# Patient Record
Sex: Male | Born: 1970 | Race: Black or African American | Hispanic: No | Marital: Married | Smoking: Former smoker
Health system: Southern US, Community
[De-identification: ages and names within clinical notes are randomized; demographics above are authoritative.]

## PROBLEM LIST (undated history)

## (undated) DIAGNOSIS — I209 Angina pectoris, unspecified: Secondary | ICD-10-CM

## (undated) DIAGNOSIS — G4730 Sleep apnea, unspecified: Secondary | ICD-10-CM

## (undated) DIAGNOSIS — M1990 Unspecified osteoarthritis, unspecified site: Secondary | ICD-10-CM

## (undated) DIAGNOSIS — E079 Disorder of thyroid, unspecified: Secondary | ICD-10-CM

## (undated) DIAGNOSIS — F32A Depression, unspecified: Secondary | ICD-10-CM

## (undated) DIAGNOSIS — F419 Anxiety disorder, unspecified: Secondary | ICD-10-CM

## (undated) DIAGNOSIS — I219 Acute myocardial infarction, unspecified: Secondary | ICD-10-CM

## (undated) DIAGNOSIS — E119 Type 2 diabetes mellitus without complications: Secondary | ICD-10-CM

## (undated) HISTORY — DX: Depression, unspecified: F32.A

## (undated) HISTORY — PX: HERNIA REPAIR: SHX51

## (undated) HISTORY — DX: Unspecified osteoarthritis, unspecified site: M19.90

## (undated) HISTORY — DX: Anxiety disorder, unspecified: F41.9

## (undated) HISTORY — DX: Disorder of thyroid, unspecified: E07.9

## (undated) HISTORY — DX: Sleep apnea, unspecified: G47.30

---

## 2020-11-29 ENCOUNTER — Inpatient Hospital Stay (HOSPITAL_COMMUNITY): Payer: 59 | Admitting: Anesthesiology

## 2020-11-29 ENCOUNTER — Inpatient Hospital Stay (HOSPITAL_COMMUNITY)
Admission: EM | Disposition: A | Discharge status: Home / Self Care | Payer: Self-pay | Source: Home / Self Care | Attending: Surgery

## 2020-11-29 ENCOUNTER — Inpatient Hospital Stay (HOSPITAL_COMMUNITY): Payer: 59

## 2020-11-29 ENCOUNTER — Encounter (HOSPITAL_COMMUNITY): Payer: Self-pay | Admitting: Cardiovascular Disease

## 2020-11-29 ENCOUNTER — Inpatient Hospital Stay (HOSPITAL_COMMUNITY)
Admission: EM | Admit: 2020-11-29 | Discharge: 2020-12-04 | DRG: 234 | Disposition: A | Discharge status: Home / Self Care | Payer: 59 | Attending: Surgery | Admitting: Surgery

## 2020-11-29 DIAGNOSIS — I213 ST elevation (STEMI) myocardial infarction of unspecified site: Secondary | ICD-10-CM

## 2020-11-29 DIAGNOSIS — F17210 Nicotine dependence, cigarettes, uncomplicated: Secondary | ICD-10-CM | POA: Diagnosis present

## 2020-11-29 DIAGNOSIS — I2119 ST elevation (STEMI) myocardial infarction involving other coronary artery of inferior wall: Principal | ICD-10-CM

## 2020-11-29 DIAGNOSIS — I2102 ST elevation (STEMI) myocardial infarction involving left anterior descending coronary artery: Secondary | ICD-10-CM | POA: Diagnosis present

## 2020-11-29 DIAGNOSIS — Z8249 Family history of ischemic heart disease and other diseases of the circulatory system: Secondary | ICD-10-CM | POA: Diagnosis not present

## 2020-11-29 DIAGNOSIS — E663 Overweight: Secondary | ICD-10-CM | POA: Diagnosis present

## 2020-11-29 DIAGNOSIS — I2510 Atherosclerotic heart disease of native coronary artery without angina pectoris: Secondary | ICD-10-CM | POA: Diagnosis present

## 2020-11-29 DIAGNOSIS — I4891 Unspecified atrial fibrillation: Secondary | ICD-10-CM | POA: Diagnosis not present

## 2020-11-29 DIAGNOSIS — E119 Type 2 diabetes mellitus without complications: Secondary | ICD-10-CM | POA: Diagnosis present

## 2020-11-29 DIAGNOSIS — Z951 Presence of aortocoronary bypass graft: Principal | ICD-10-CM

## 2020-11-29 DIAGNOSIS — Z7984 Long term (current) use of oral hypoglycemic drugs: Secondary | ICD-10-CM | POA: Diagnosis not present

## 2020-11-29 DIAGNOSIS — I10 Essential (primary) hypertension: Secondary | ICD-10-CM | POA: Diagnosis present

## 2020-11-29 DIAGNOSIS — Z20822 Contact with and (suspected) exposure to covid-19: Secondary | ICD-10-CM | POA: Diagnosis not present

## 2020-11-29 DIAGNOSIS — E785 Hyperlipidemia, unspecified: Secondary | ICD-10-CM | POA: Diagnosis present

## 2020-11-29 DIAGNOSIS — I25110 Atherosclerotic heart disease of native coronary artery with unstable angina pectoris: Secondary | ICD-10-CM | POA: Diagnosis not present

## 2020-11-29 HISTORY — DX: Acute myocardial infarction, unspecified: I21.9

## 2020-11-29 HISTORY — PX: CORONARY ARTERY BYPASS GRAFT: SHX141

## 2020-11-29 HISTORY — DX: Type 2 diabetes mellitus without complications: E11.9

## 2020-11-29 HISTORY — PX: CORONARY/GRAFT ACUTE MI REVASCULARIZATION: CATH118305

## 2020-11-29 HISTORY — DX: Angina pectoris, unspecified: I20.9

## 2020-11-29 HISTORY — PX: TEE WITHOUT CARDIOVERSION: SHX5443

## 2020-11-29 LAB — POCT I-STAT, CHEM 8
BUN: 17 mg/dL (ref 6–20)
BUN: 16 mg/dL (ref 6–20)
BUN: 16 mg/dL (ref 6–20)
BUN: 16 mg/dL (ref 6–20)
BUN: 15 mg/dL (ref 6–20)
Calcium, Ion: 1.23 mmol/L (ref 1.15–1.40)
Calcium, Ion: 1.21 mmol/L (ref 1.15–1.40)
Calcium, Ion: 1.08 mmol/L — ABNORMAL LOW (ref 1.15–1.40)
Calcium, Ion: 1.2 mmol/L (ref 1.15–1.40)
Calcium, Ion: 1.11 mmol/L — ABNORMAL LOW (ref 1.15–1.40)
Chloride: 108 mmol/L (ref 98–111)
Chloride: 106 mmol/L (ref 98–111)
Chloride: 105 mmol/L (ref 98–111)
Chloride: 107 mmol/L (ref 98–111)
Chloride: 105 mmol/L (ref 98–111)
Creatinine, Ser: 0.9 mg/dL (ref 0.61–1.24)
Creatinine, Ser: 0.9 mg/dL (ref 0.61–1.24)
Creatinine, Ser: 0.9 mg/dL (ref 0.61–1.24)
Creatinine, Ser: 0.9 mg/dL (ref 0.61–1.24)
Creatinine, Ser: 0.9 mg/dL (ref 0.61–1.24)
Glucose, Bld: 122 mg/dL — ABNORMAL HIGH (ref 70–99)
Glucose, Bld: 102 mg/dL — ABNORMAL HIGH (ref 70–99)
Glucose, Bld: 109 mg/dL — ABNORMAL HIGH (ref 70–99)
Glucose, Bld: 112 mg/dL — ABNORMAL HIGH (ref 70–99)
Glucose, Bld: 112 mg/dL — ABNORMAL HIGH (ref 70–99)
HCT: 38 % — ABNORMAL LOW (ref 39.0–52.0)
HCT: 37 % — ABNORMAL LOW (ref 39.0–52.0)
HCT: 28 % — ABNORMAL LOW (ref 39.0–52.0)
HCT: 34 % — ABNORMAL LOW (ref 39.0–52.0)
HCT: 31 % — ABNORMAL LOW (ref 39.0–52.0)
Hemoglobin: 12.9 g/dL — ABNORMAL LOW (ref 13.0–17.0)
Hemoglobin: 12.6 g/dL — ABNORMAL LOW (ref 13.0–17.0)
Hemoglobin: 11.6 g/dL — ABNORMAL LOW (ref 13.0–17.0)
Hemoglobin: 9.5 g/dL — ABNORMAL LOW (ref 13.0–17.0)
Hemoglobin: 10.5 g/dL — ABNORMAL LOW (ref 13.0–17.0)
Potassium: 4.6 mmol/L (ref 3.5–5.1)
Potassium: 4.4 mmol/L (ref 3.5–5.1)
Potassium: 4.4 mmol/L (ref 3.5–5.1)
Potassium: 5.4 mmol/L — ABNORMAL HIGH (ref 3.5–5.1)
Potassium: 4.6 mmol/L (ref 3.5–5.1)
Sodium: 141 mmol/L (ref 135–145)
Sodium: 141 mmol/L (ref 135–145)
Sodium: 138 mmol/L (ref 135–145)
Sodium: 141 mmol/L (ref 135–145)
Sodium: 140 mmol/L (ref 135–145)
TCO2: 22 mmol/L (ref 22–32)
TCO2: 25 mmol/L (ref 22–32)
TCO2: 24 mmol/L (ref 22–32)
TCO2: 25 mmol/L (ref 22–32)
TCO2: 23 mmol/L (ref 22–32)

## 2020-11-29 LAB — HEMOGLOBIN AND HEMATOCRIT, BLOOD
HCT: 28.8 % — ABNORMAL LOW (ref 39.0–52.0)
Hemoglobin: 9 g/dL — ABNORMAL LOW (ref 13.0–17.0)

## 2020-11-29 LAB — POCT I-STAT 7, (LYTES, BLD GAS, ICA,H+H)
Acid-Base Excess: 1 mmol/L (ref 0.0–2.0)
Acid-Base Excess: 1 mmol/L (ref 0.0–2.0)
Acid-Base Excess: 0 mmol/L (ref 0.0–2.0)
Acid-base deficit: 2 mmol/L (ref 0.0–2.0)
Acid-base deficit: 2 mmol/L (ref 0.0–2.0)
Acid-base deficit: 2 mmol/L (ref 0.0–2.0)
Bicarbonate: 23.6 mmol/L (ref 20.0–28.0)
Bicarbonate: 24.2 mmol/L (ref 20.0–28.0)
Bicarbonate: 24.9 mmol/L (ref 20.0–28.0)
Bicarbonate: 25.9 mmol/L (ref 20.0–28.0)
Bicarbonate: 26.1 mmol/L (ref 20.0–28.0)
Bicarbonate: 26.4 mmol/L (ref 20.0–28.0)
Calcium, Ion: 1 mmol/L — ABNORMAL LOW (ref 1.15–1.40)
Calcium, Ion: 1.06 mmol/L — ABNORMAL LOW (ref 1.15–1.40)
Calcium, Ion: 1.12 mmol/L — ABNORMAL LOW (ref 1.15–1.40)
Calcium, Ion: 1.13 mmol/L — ABNORMAL LOW (ref 1.15–1.40)
Calcium, Ion: 1.16 mmol/L (ref 1.15–1.40)
Calcium, Ion: 1.21 mmol/L (ref 1.15–1.40)
HCT: 30 % — ABNORMAL LOW (ref 39.0–52.0)
HCT: 31 % — ABNORMAL LOW (ref 39.0–52.0)
HCT: 33 % — ABNORMAL LOW (ref 39.0–52.0)
HCT: 33 % — ABNORMAL LOW (ref 39.0–52.0)
HCT: 34 % — ABNORMAL LOW (ref 39.0–52.0)
HCT: 39 % (ref 39.0–52.0)
Hemoglobin: 10.2 g/dL — ABNORMAL LOW (ref 13.0–17.0)
Hemoglobin: 10.5 g/dL — ABNORMAL LOW (ref 13.0–17.0)
Hemoglobin: 11.2 g/dL — ABNORMAL LOW (ref 13.0–17.0)
Hemoglobin: 11.2 g/dL — ABNORMAL LOW (ref 13.0–17.0)
Hemoglobin: 11.6 g/dL — ABNORMAL LOW (ref 13.0–17.0)
Hemoglobin: 13.3 g/dL (ref 13.0–17.0)
O2 Saturation: 100 %
O2 Saturation: 100 %
O2 Saturation: 94 %
O2 Saturation: 98 %
O2 Saturation: 99 %
O2 Saturation: 99 %
Patient temperature: 36.4
Patient temperature: 37.4
Potassium: 4 mmol/L (ref 3.5–5.1)
Potassium: 4.4 mmol/L (ref 3.5–5.1)
Potassium: 4.4 mmol/L (ref 3.5–5.1)
Potassium: 4.6 mmol/L (ref 3.5–5.1)
Potassium: 4.6 mmol/L (ref 3.5–5.1)
Potassium: 5.4 mmol/L — ABNORMAL HIGH (ref 3.5–5.1)
Sodium: 138 mmol/L (ref 135–145)
Sodium: 141 mmol/L (ref 135–145)
Sodium: 142 mmol/L (ref 135–145)
Sodium: 142 mmol/L (ref 135–145)
Sodium: 142 mmol/L (ref 135–145)
Sodium: 142 mmol/L (ref 135–145)
TCO2: 25 mmol/L (ref 22–32)
TCO2: 26 mmol/L (ref 22–32)
TCO2: 26 mmol/L (ref 22–32)
TCO2: 27 mmol/L (ref 22–32)
TCO2: 27 mmol/L (ref 22–32)
TCO2: 28 mmol/L (ref 22–32)
pCO2 arterial: 42.7 mmHg (ref 32.0–48.0)
pCO2 arterial: 44.3 mmHg (ref 32.0–48.0)
pCO2 arterial: 45 mmHg (ref 32.0–48.0)
pCO2 arterial: 46 mmHg (ref 32.0–48.0)
pCO2 arterial: 49.4 mmHg — ABNORMAL HIGH (ref 32.0–48.0)
pCO2 arterial: 49.7 mmHg — ABNORMAL HIGH (ref 32.0–48.0)
pH, Arterial: 7.311 — ABNORMAL LOW (ref 7.350–7.450)
pH, Arterial: 7.328 — ABNORMAL LOW (ref 7.350–7.450)
pH, Arterial: 7.328 — ABNORMAL LOW (ref 7.350–7.450)
pH, Arterial: 7.331 — ABNORMAL LOW (ref 7.350–7.450)
pH, Arterial: 7.379 (ref 7.350–7.450)
pH, Arterial: 7.392 (ref 7.350–7.450)
pO2, Arterial: 125 mmHg — ABNORMAL HIGH (ref 83.0–108.0)
pO2, Arterial: 145 mmHg — ABNORMAL HIGH (ref 83.0–108.0)
pO2, Arterial: 166 mmHg — ABNORMAL HIGH (ref 83.0–108.0)
pO2, Arterial: 290 mmHg — ABNORMAL HIGH (ref 83.0–108.0)
pO2, Arterial: 310 mmHg — ABNORMAL HIGH (ref 83.0–108.0)
pO2, Arterial: 78 mmHg — ABNORMAL LOW (ref 83.0–108.0)

## 2020-11-29 LAB — CBC
HCT: 40.1 % (ref 39.0–52.0)
HCT: 35 % — ABNORMAL LOW (ref 39.0–52.0)
Hemoglobin: 12.3 g/dL — ABNORMAL LOW (ref 13.0–17.0)
Hemoglobin: 10.7 g/dL — ABNORMAL LOW (ref 13.0–17.0)
MCH: 22.7 pg — ABNORMAL LOW (ref 26.0–34.0)
MCH: 22.8 pg — ABNORMAL LOW (ref 26.0–34.0)
MCHC: 30.7 g/dL (ref 30.0–36.0)
MCHC: 30.6 g/dL (ref 30.0–36.0)
MCV: 74 fL — ABNORMAL LOW (ref 80.0–100.0)
MCV: 74.6 fL — ABNORMAL LOW (ref 80.0–100.0)
Platelets: 303 10*3/uL (ref 150–400)
Platelets: 215 10*3/uL (ref 150–400)
RBC: 5.42 MIL/uL (ref 4.22–5.81)
RBC: 4.69 MIL/uL (ref 4.22–5.81)
RDW: 15.6 % — ABNORMAL HIGH (ref 11.5–15.5)
RDW: 15.6 % — ABNORMAL HIGH (ref 11.5–15.5)
WBC: 7.6 10*3/uL (ref 4.0–10.5)
WBC: 8.9 10*3/uL (ref 4.0–10.5)
nRBC: 0 % (ref 0.0–0.2)
nRBC: 0 % (ref 0.0–0.2)

## 2020-11-29 LAB — POCT I-STAT EG7
Acid-Base Excess: 0 mmol/L (ref 0.0–2.0)
Bicarbonate: 25.8 mmol/L (ref 20.0–28.0)
Calcium, Ion: 1.06 mmol/L — ABNORMAL LOW (ref 1.15–1.40)
HCT: 30 % — ABNORMAL LOW (ref 39.0–52.0)
Hemoglobin: 10.2 g/dL — ABNORMAL LOW (ref 13.0–17.0)
O2 Saturation: 76 %
Potassium: 5.5 mmol/L — ABNORMAL HIGH (ref 3.5–5.1)
Sodium: 142 mmol/L (ref 135–145)
TCO2: 27 mmol/L (ref 22–32)
pCO2, Ven: 45.2 mmHg (ref 44.0–60.0)
pH, Ven: 7.364 (ref 7.250–7.430)
pO2, Ven: 43 mmHg (ref 32.0–45.0)

## 2020-11-29 LAB — GLUCOSE, CAPILLARY
Glucose-Capillary: 103 mg/dL — ABNORMAL HIGH (ref 70–99)
Glucose-Capillary: 107 mg/dL — ABNORMAL HIGH (ref 70–99)
Glucose-Capillary: 114 mg/dL — ABNORMAL HIGH (ref 70–99)
Glucose-Capillary: 121 mg/dL — ABNORMAL HIGH (ref 70–99)

## 2020-11-29 LAB — COMPREHENSIVE METABOLIC PANEL
ALT: 16 U/L (ref 0–44)
AST: 16 U/L (ref 15–41)
Albumin: 3.3 g/dL — ABNORMAL LOW (ref 3.5–5.0)
Alkaline Phosphatase: 41 U/L (ref 38–126)
Anion gap: 7 (ref 5–15)
BUN: 16 mg/dL (ref 6–20)
CO2: 23 mmol/L (ref 22–32)
Calcium: 8.4 mg/dL — ABNORMAL LOW (ref 8.9–10.3)
Chloride: 108 mmol/L (ref 98–111)
Creatinine, Ser: 0.91 mg/dL (ref 0.61–1.24)
GFR, Estimated: >60 mL/min (ref >60)
Glucose, Bld: 94 mg/dL (ref 70–99)
Potassium: 4.5 mmol/L (ref 3.5–5.1)
Sodium: 138 mmol/L (ref 135–145)
Total Bilirubin: 0.6 mg/dL (ref 0.3–1.2)
Total Protein: 6.1 g/dL — ABNORMAL LOW (ref 6.5–8.1)

## 2020-11-29 LAB — PROTIME-INR
INR: 1.2 (ref 0.8–1.2)
INR: 1.2 (ref 0.8–1.2)
Prothrombin Time: 14.9 seconds (ref 11.4–15.2)
Prothrombin Time: 15 seconds (ref 11.4–15.2)

## 2020-11-29 LAB — MRSA PCR SCREENING: MRSA by PCR: NEGATIVE

## 2020-11-29 LAB — HEMOGLOBIN A1C
Hgb A1c MFr Bld: 7.3 % — ABNORMAL HIGH (ref 4.8–5.6)
Mean Plasma Glucose: 162.81 mg/dL

## 2020-11-29 LAB — POCT ACTIVATED CLOTTING TIME: Activated Clotting Time: 297 seconds

## 2020-11-29 LAB — RESP PANEL BY RT-PCR (FLU A&B, COVID) ARPGX2
Influenza A by PCR: NEGATIVE
Influenza B by PCR: NEGATIVE
SARS Coronavirus 2 by RT PCR: NEGATIVE

## 2020-11-29 LAB — PLATELET COUNT: Platelets: 230 10*3/uL (ref 150–400)

## 2020-11-29 LAB — PREPARE RBC (CROSSMATCH)

## 2020-11-29 LAB — ABO/RH: ABO/RH(D): B POS

## 2020-11-29 LAB — APTT
aPTT: >200 seconds (ref 24–36)
aPTT: 32 seconds (ref 24–36)

## 2020-11-29 SURGERY — CORONARY ARTERY BYPASS GRAFTING (CABG)
Anesthesia: General | Site: Chest

## 2020-11-29 SURGERY — CORONARY/GRAFT ACUTE MI REVASCULARIZATION
Anesthesia: LOCAL

## 2020-11-29 MED ORDER — ACETAMINOPHEN 500 MG PO TABS
1000.0000 mg | ORAL_TABLET | Freq: Four times a day (QID) | ORAL | Status: DC
  Administered 2020-11-30 – 2020-12-01 (×5): 1000 mg via ORAL
  Filled 2020-11-29 (×5): qty 2

## 2020-11-29 MED ORDER — ACETAMINOPHEN 160 MG/5ML PO SOLN: 1000.0000 mg | Freq: Four times a day (QID) | ORAL | Status: DC

## 2020-11-29 MED ORDER — FENTANYL CITRATE (PF) 250 MCG/5ML IJ SOLN
INTRAMUSCULAR | Status: DC | PRN
  Administered 2020-11-29: 200 ug via INTRAVENOUS
  Administered 2020-11-29: 250 ug via INTRAVENOUS
  Administered 2020-11-29: 200 ug via INTRAVENOUS
  Administered 2020-11-29: 150 ug via INTRAVENOUS
  Administered 2020-11-29 (×3): 100 ug via INTRAVENOUS

## 2020-11-29 MED ORDER — FENTANYL CITRATE (PF) 250 MCG/5ML IJ SOLN
INTRAMUSCULAR | Status: AC
  Filled 2020-11-29: qty 25

## 2020-11-29 MED ORDER — EPINEPHRINE HCL 5 MG/250ML IV SOLN IN NS
0.0000 ug/min | INTRAVENOUS | Status: DC
Start: 2020-11-29 — End: 2020-11-29
  Filled 2020-11-29: qty 250

## 2020-11-29 MED ORDER — METOPROLOL TARTRATE 12.5 MG HALF TABLET: 12.5000 mg | ORAL_TABLET | Freq: Two times a day (BID) | ORAL | Status: DC

## 2020-11-29 MED ORDER — INSULIN REGULAR(HUMAN) IN NACL 100-0.9 UT/100ML-% IV SOLN
INTRAVENOUS | Status: AC
  Administered 2020-11-29: 2 [IU]/h via INTRAVENOUS
  Filled 2020-11-29: qty 100

## 2020-11-29 MED ORDER — CHLORHEXIDINE GLUCONATE CLOTH 2 % EX PADS: 6.0000 | MEDICATED_PAD | Freq: Every day | CUTANEOUS | Status: DC

## 2020-11-29 MED ORDER — LIDOCAINE HCL (PF) 1 % IJ SOLN
INTRAMUSCULAR | Status: DC | PRN
  Administered 2020-11-29: 2 mL

## 2020-11-29 MED ORDER — ACETAMINOPHEN 160 MG/5ML PO SOLN: 650.0000 mg | Freq: Once | ORAL | Status: AC

## 2020-11-29 MED ORDER — TRAMADOL HCL 50 MG PO TABS
50.0000 mg | ORAL_TABLET | ORAL | Status: DC | PRN
  Administered 2020-11-30 (×2): 100 mg via ORAL
  Administered 2020-11-30: 50 mg via ORAL
  Administered 2020-12-01: 100 mg via ORAL
  Filled 2020-11-29: qty 2
  Filled 2020-11-29: qty 1
  Filled 2020-11-29 (×2): qty 2

## 2020-11-29 MED ORDER — PHENYLEPHRINE HCL-NACL 20-0.9 MG/250ML-% IV SOLN
0.0000 ug/min | INTRAVENOUS | Status: DC
Start: 2020-11-29 — End: 2020-12-01
  Administered 2020-11-30: 10 ug/min via INTRAVENOUS
  Filled 2020-11-29: qty 250

## 2020-11-29 MED ORDER — SODIUM CHLORIDE 0.9 % IV SOLN
INTRAVENOUS | Status: DC
  Filled 2020-11-29: qty 30

## 2020-11-29 MED ORDER — NITROGLYCERIN 1 MG/10 ML FOR IR/CATH LAB
INTRA_ARTERIAL | Status: AC
  Filled 2020-11-29: qty 10

## 2020-11-29 MED ORDER — DOCUSATE SODIUM 100 MG PO CAPS
200.0000 mg | ORAL_CAPSULE | Freq: Every day | ORAL | Status: DC
  Administered 2020-11-30 – 2020-12-01 (×2): 200 mg via ORAL
  Filled 2020-11-29 (×2): qty 2

## 2020-11-29 MED ORDER — SODIUM CHLORIDE 0.9% FLUSH: 3.0000 mL | Freq: Two times a day (BID) | INTRAVENOUS | Status: DC

## 2020-11-29 MED ORDER — HEPARIN SODIUM (PORCINE) 1000 UNIT/ML IJ SOLN
INTRAMUSCULAR | Status: DC | PRN
  Administered 2020-11-29: 30000 [IU] via INTRAVENOUS

## 2020-11-29 MED ORDER — CEFAZOLIN SODIUM-DEXTROSE 2-4 GM/100ML-% IV SOLN
2.0000 g | INTRAVENOUS | Status: DC
  Filled 2020-11-29: qty 100

## 2020-11-29 MED ORDER — MIDAZOLAM HCL 2 MG/2ML IJ SOLN
2.0000 mg | INTRAMUSCULAR | Status: DC | PRN
  Administered 2020-11-29: 2 mg via INTRAVENOUS
  Filled 2020-11-29: qty 2

## 2020-11-29 MED ORDER — HEPARIN SODIUM (PORCINE) 1000 UNIT/ML IJ SOLN
INTRAMUSCULAR | Status: DC | PRN
  Administered 2020-11-29: 10000 [IU] via INTRAVENOUS

## 2020-11-29 MED ORDER — THROMBIN 20000 UNITS EX SOLR
CUTANEOUS | Status: DC | PRN
  Administered 2020-11-29: 20000 [IU] via TOPICAL

## 2020-11-29 MED ORDER — PROPOFOL 10 MG/ML IV BOLUS
INTRAVENOUS | Status: AC
  Filled 2020-11-29: qty 40

## 2020-11-29 MED ORDER — TRANEXAMIC ACID (OHS) BOLUS VIA INFUSION
15.0000 mg/kg | INTRAVENOUS | Status: AC
  Administered 2020-11-29: 1497 mg via INTRAVENOUS
  Filled 2020-11-29: qty 1497

## 2020-11-29 MED ORDER — ASPIRIN 81 MG PO CHEW: 81.0000 mg | CHEWABLE_TABLET | Freq: Every day | ORAL | Status: DC

## 2020-11-29 MED ORDER — LACTATED RINGERS IV SOLN: INTRAVENOUS | Status: DC

## 2020-11-29 MED ORDER — LACTATED RINGERS IV SOLN
500.0000 mL | Freq: Once | INTRAVENOUS | Status: AC | PRN
  Administered 2020-11-30: 500 mL via INTRAVENOUS

## 2020-11-29 MED ORDER — PHENYLEPHRINE 40 MCG/ML (10ML) SYRINGE FOR IV PUSH (FOR BLOOD PRESSURE SUPPORT)
PREFILLED_SYRINGE | INTRAVENOUS | Status: DC | PRN
  Administered 2020-11-29: 80 ug via INTRAVENOUS

## 2020-11-29 MED ORDER — VERAPAMIL HCL 2.5 MG/ML IV SOLN: INTRA_ARTERIAL | Status: DC | PRN

## 2020-11-29 MED ORDER — ONDANSETRON HCL 4 MG/2ML IJ SOLN: 4.0000 mg | Freq: Four times a day (QID) | INTRAMUSCULAR | Status: DC | PRN

## 2020-11-29 MED ORDER — CEFAZOLIN SODIUM-DEXTROSE 2-4 GM/100ML-% IV SOLN
2.0000 g | INTRAVENOUS | Status: AC
  Administered 2020-11-29 (×2): 2 g via INTRAVENOUS
  Filled 2020-11-29: qty 100

## 2020-11-29 MED ORDER — LACTATED RINGERS IV SOLN: INTRAVENOUS | Status: DC | PRN

## 2020-11-29 MED ORDER — VERAPAMIL HCL 2.5 MG/ML IV SOLN
INTRAVENOUS | Status: AC
  Filled 2020-11-29: qty 2

## 2020-11-29 MED ORDER — SODIUM CHLORIDE 0.45 % IV SOLN: INTRAVENOUS | Status: DC | PRN

## 2020-11-29 MED ORDER — MIDAZOLAM HCL (PF) 10 MG/2ML IJ SOLN
INTRAMUSCULAR | Status: AC
  Filled 2020-11-29: qty 2

## 2020-11-29 MED ORDER — PANTOPRAZOLE SODIUM 40 MG PO TBEC
40.0000 mg | DELAYED_RELEASE_TABLET | Freq: Every day | ORAL | Status: DC
  Administered 2020-12-01: 40 mg via ORAL
  Filled 2020-11-29: qty 1

## 2020-11-29 MED ORDER — MAGNESIUM SULFATE 50 % IJ SOLN
40.0000 meq | INTRAMUSCULAR | Status: DC
  Filled 2020-11-29: qty 9.85

## 2020-11-29 MED ORDER — LABETALOL HCL 5 MG/ML IV SOLN: 10.0000 mg | INTRAVENOUS | Status: DC | PRN

## 2020-11-29 MED ORDER — METOPROLOL TARTRATE 5 MG/5ML IV SOLN
2.5000 mg | INTRAVENOUS | Status: DC | PRN
Start: 2020-11-29 — End: 2020-12-01

## 2020-11-29 MED ORDER — INSULIN REGULAR(HUMAN) IN NACL 100-0.9 UT/100ML-% IV SOLN: INTRAVENOUS | Status: DC

## 2020-11-29 MED ORDER — PLASMA-LYTE 148 IV SOLN
INTRAVENOUS | Status: AC
  Administered 2020-11-29: 14 mL
  Filled 2020-11-29: qty 2.5

## 2020-11-29 MED ORDER — CHLORHEXIDINE GLUCONATE 0.12 % MT SOLN
OROMUCOSAL | Status: AC
  Administered 2020-11-29: 15 mL via OROMUCOSAL
  Filled 2020-11-29: qty 15

## 2020-11-29 MED ORDER — PROPOFOL 10 MG/ML IV BOLUS
INTRAVENOUS | Status: DC | PRN
  Administered 2020-11-29: 50 mg via INTRAVENOUS
  Administered 2020-11-29: 150 mg via INTRAVENOUS

## 2020-11-29 MED ORDER — 0.9 % SODIUM CHLORIDE (POUR BTL) OPTIME
TOPICAL | Status: DC | PRN
  Administered 2020-11-29: 4000 mL

## 2020-11-29 MED ORDER — PROTAMINE SULFATE 10 MG/ML IV SOLN
INTRAVENOUS | Status: AC
  Filled 2020-11-29: qty 5

## 2020-11-29 MED ORDER — CHLORHEXIDINE GLUCONATE 0.12 % MT SOLN
15.0000 mL | Freq: Once | OROMUCOSAL | Status: AC
  Filled 2020-11-29: qty 15

## 2020-11-29 MED ORDER — SODIUM CHLORIDE 0.9% FLUSH
10.0000 mL | Freq: Two times a day (BID) | INTRAVENOUS | Status: DC
  Administered 2020-11-30: 10 mL

## 2020-11-29 MED ORDER — ASPIRIN EC 325 MG PO TBEC
325.0000 mg | DELAYED_RELEASE_TABLET | Freq: Every day | ORAL | Status: DC
  Administered 2020-11-30 – 2020-12-01 (×2): 325 mg via ORAL
  Filled 2020-11-29 (×2): qty 1

## 2020-11-29 MED ORDER — ROCURONIUM BROMIDE 10 MG/ML (PF) SYRINGE
PREFILLED_SYRINGE | INTRAVENOUS | Status: DC | PRN
  Administered 2020-11-29: 40 mg via INTRAVENOUS
  Administered 2020-11-29 (×2): 50 mg via INTRAVENOUS
  Administered 2020-11-29: 60 mg via INTRAVENOUS

## 2020-11-29 MED ORDER — ALBUMIN HUMAN 5 % IV SOLN: INTRAVENOUS | Status: DC | PRN

## 2020-11-29 MED ORDER — ACETAMINOPHEN 325 MG PO TABS: 650.0000 mg | ORAL_TABLET | ORAL | Status: DC | PRN

## 2020-11-29 MED ORDER — TRANEXAMIC ACID 1000 MG/10ML IV SOLN
1.5000 mg/kg/h | INTRAVENOUS | Status: AC
  Administered 2020-11-29: 1.5 mg/kg/h via INTRAVENOUS
  Filled 2020-11-29: qty 25

## 2020-11-29 MED ORDER — DEXMEDETOMIDINE HCL IN NACL 400 MCG/100ML IV SOLN
0.1000 ug/kg/h | INTRAVENOUS | Status: AC
Start: 2020-11-29 — End: 2020-11-29
  Administered 2020-11-29: .7 ug/kg/h via INTRAVENOUS
  Filled 2020-11-29: qty 100

## 2020-11-29 MED ORDER — SODIUM CHLORIDE 0.9% FLUSH: 10.0000 mL | INTRAVENOUS | Status: DC | PRN

## 2020-11-29 MED ORDER — PROTAMINE SULFATE 10 MG/ML IV SOLN
INTRAVENOUS | Status: DC | PRN
  Administered 2020-11-29: 30 mg via INTRAVENOUS
  Administered 2020-11-29: 270 mg via INTRAVENOUS

## 2020-11-29 MED ORDER — MILRINONE LACTATE IN DEXTROSE 20-5 MG/100ML-% IV SOLN
0.3000 ug/kg/min | INTRAVENOUS | Status: DC
  Filled 2020-11-29: qty 100

## 2020-11-29 MED ORDER — ALBUMIN HUMAN 5 % IV SOLN
250.0000 mL | INTRAVENOUS | Status: AC | PRN
  Administered 2020-11-29 – 2020-11-30 (×4): 12.5 g via INTRAVENOUS
  Filled 2020-11-29 (×2): qty 250

## 2020-11-29 MED ORDER — DEXMEDETOMIDINE HCL IN NACL 400 MCG/100ML IV SOLN
0.0000 ug/kg/h | INTRAVENOUS | Status: DC
  Administered 2020-11-29: 0.5 ug/kg/h via INTRAVENOUS
  Filled 2020-11-29: qty 100

## 2020-11-29 MED ORDER — POTASSIUM CHLORIDE 10 MEQ/50ML IV SOLN: 10.0000 meq | INTRAVENOUS | Status: AC

## 2020-11-29 MED ORDER — MORPHINE SULFATE (PF) 2 MG/ML IV SOLN
1.0000 mg | INTRAVENOUS | Status: DC | PRN
  Administered 2020-11-29: 2 mg via INTRAVENOUS
  Administered 2020-11-30: 4 mg via INTRAVENOUS
  Filled 2020-11-29: qty 2
  Filled 2020-11-29: qty 1

## 2020-11-29 MED ORDER — HEPARIN (PORCINE) IN NACL 1000-0.9 UT/500ML-% IV SOLN
INTRAVENOUS | Status: DC | PRN
  Administered 2020-11-29 (×2): 500 mL

## 2020-11-29 MED ORDER — VANCOMYCIN HCL 1500 MG/300ML IV SOLN
1500.0000 mg | INTRAVENOUS | Status: AC
  Administered 2020-11-29: 1500 mg via INTRAVENOUS
  Filled 2020-11-29: qty 300

## 2020-11-29 MED ORDER — SODIUM CHLORIDE 0.9 % IV SOLN: INTRAVENOUS | Status: DC

## 2020-11-29 MED ORDER — IOHEXOL 350 MG/ML SOLN
INTRAVENOUS | Status: DC | PRN
  Administered 2020-11-29: 75 mL

## 2020-11-29 MED ORDER — HEMOSTATIC AGENTS (NO CHARGE) OPTIME
TOPICAL | Status: DC | PRN
  Administered 2020-11-29: 1 via TOPICAL

## 2020-11-29 MED ORDER — PLASMA-LYTE 148 IV SOLN
INTRAVENOUS | Status: DC | PRN
  Administered 2020-11-29: 500 mL via INTRAVASCULAR

## 2020-11-29 MED ORDER — ASPIRIN 81 MG PO CHEW: 324.0000 mg | CHEWABLE_TABLET | Freq: Every day | ORAL | Status: DC

## 2020-11-29 MED ORDER — CHLORHEXIDINE GLUCONATE 0.12% ORAL RINSE (MEDLINE KIT)
15.0000 mL | Freq: Two times a day (BID) | OROMUCOSAL | Status: DC
  Administered 2020-11-29: 15 mL via OROMUCOSAL

## 2020-11-29 MED ORDER — HEPARIN (PORCINE) IN NACL 1000-0.9 UT/500ML-% IV SOLN
INTRAVENOUS | Status: AC
  Filled 2020-11-29: qty 1000

## 2020-11-29 MED ORDER — BISACODYL 10 MG RE SUPP: 10.0000 mg | Freq: Every day | RECTAL | Status: DC

## 2020-11-29 MED ORDER — SODIUM CHLORIDE 0.9 % IV SOLN: INTRAVENOUS | Status: DC | PRN

## 2020-11-29 MED ORDER — CEFAZOLIN SODIUM-DEXTROSE 2-4 GM/100ML-% IV SOLN
2.0000 g | Freq: Three times a day (TID) | INTRAVENOUS | Status: DC
  Administered 2020-11-29 – 2020-12-01 (×5): 2 g via INTRAVENOUS
  Filled 2020-11-29 (×6): qty 100

## 2020-11-29 MED ORDER — HEPARIN (PORCINE) IN NACL 2000-0.9 UNIT/L-% IV SOLN
INTRAVENOUS | Status: AC
  Filled 2020-11-29: qty 1000

## 2020-11-29 MED ORDER — THROMBIN (RECOMBINANT) 20000 UNITS EX SOLR
CUTANEOUS | Status: AC
  Filled 2020-11-29: qty 20000

## 2020-11-29 MED ORDER — SODIUM CHLORIDE 0.9 % IV SOLN: 250.0000 mL | INTRAVENOUS | Status: DC | PRN

## 2020-11-29 MED ORDER — METOPROLOL TARTRATE 25 MG/10 ML ORAL SUSPENSION: 12.5000 mg | Freq: Two times a day (BID) | ORAL | Status: DC

## 2020-11-29 MED ORDER — BISACODYL 5 MG PO TBEC
10.0000 mg | DELAYED_RELEASE_TABLET | Freq: Every day | ORAL | Status: DC
  Administered 2020-11-30 – 2020-12-01 (×2): 10 mg via ORAL
  Filled 2020-11-29 (×2): qty 2

## 2020-11-29 MED ORDER — SODIUM CHLORIDE 0.9% FLUSH: 3.0000 mL | INTRAVENOUS | Status: DC | PRN

## 2020-11-29 MED ORDER — THROMBIN 20000 UNITS EX SOLR
OROMUCOSAL | Status: DC | PRN
  Administered 2020-11-29 (×3): 4 mL via TOPICAL

## 2020-11-29 MED ORDER — VANCOMYCIN HCL IN DEXTROSE 1-5 GM/200ML-% IV SOLN
1000.0000 mg | Freq: Once | INTRAVENOUS | Status: AC
  Administered 2020-11-30: 1000 mg via INTRAVENOUS
  Filled 2020-11-29: qty 200

## 2020-11-29 MED ORDER — PHENYLEPHRINE HCL-NACL 20-0.9 MG/250ML-% IV SOLN
30.0000 ug/min | INTRAVENOUS | Status: AC
  Administered 2020-11-29: 15 ug/min via INTRAVENOUS
  Filled 2020-11-29: qty 250

## 2020-11-29 MED ORDER — SODIUM CHLORIDE 0.9 % IV SOLN: 250.0000 mL | INTRAVENOUS | Status: DC

## 2020-11-29 MED ORDER — MAGNESIUM SULFATE 4 GM/100ML IV SOLN
4.0000 g | Freq: Once | INTRAVENOUS | Status: AC
  Administered 2020-11-29: 4 g via INTRAVENOUS
  Filled 2020-11-29: qty 100

## 2020-11-29 MED ORDER — CHLORHEXIDINE GLUCONATE CLOTH 2 % EX PADS
6.0000 | MEDICATED_PAD | Freq: Every day | CUTANEOUS | Status: DC
  Administered 2020-11-29: 6 via TOPICAL

## 2020-11-29 MED ORDER — HEPARIN SODIUM (PORCINE) 1000 UNIT/ML IJ SOLN
INTRAMUSCULAR | Status: AC
  Filled 2020-11-29: qty 1

## 2020-11-29 MED ORDER — ORAL CARE MOUTH RINSE
15.0000 mL | OROMUCOSAL | Status: DC
  Administered 2020-11-29 – 2020-11-30 (×5): 15 mL via OROMUCOSAL

## 2020-11-29 MED ORDER — LIDOCAINE HCL (PF) 1 % IJ SOLN
INTRAMUSCULAR | Status: AC
  Filled 2020-11-29: qty 30

## 2020-11-29 MED ORDER — ROCURONIUM BROMIDE 10 MG/ML (PF) SYRINGE
PREFILLED_SYRINGE | INTRAVENOUS | Status: AC
  Filled 2020-11-29: qty 10

## 2020-11-29 MED ORDER — NOREPINEPHRINE 4 MG/250ML-% IV SOLN
0.0000 ug/min | INTRAVENOUS | Status: DC
  Filled 2020-11-29: qty 250

## 2020-11-29 MED ORDER — ACETAMINOPHEN 650 MG RE SUPP
650.0000 mg | Freq: Once | RECTAL | Status: AC
  Administered 2020-11-29: 650 mg via RECTAL

## 2020-11-29 MED ORDER — SODIUM CHLORIDE 0.9% FLUSH
3.0000 mL | Freq: Two times a day (BID) | INTRAVENOUS | Status: DC
  Administered 2020-11-30 – 2020-12-01 (×3): 3 mL via INTRAVENOUS

## 2020-11-29 MED ORDER — FAMOTIDINE IN NACL 20-0.9 MG/50ML-% IV SOLN
20.0000 mg | Freq: Two times a day (BID) | INTRAVENOUS | Status: AC
  Administered 2020-11-29 – 2020-11-30 (×2): 20 mg via INTRAVENOUS
  Filled 2020-11-29 (×3): qty 50

## 2020-11-29 MED ORDER — DEXTROSE 50 % IV SOLN: 0.0000 mL | INTRAVENOUS | Status: DC | PRN

## 2020-11-29 MED ORDER — NITROGLYCERIN IN D5W 200-5 MCG/ML-% IV SOLN
2.0000 ug/min | INTRAVENOUS | Status: AC
  Administered 2020-11-29: 5 ug/min via INTRAVENOUS
  Filled 2020-11-29: qty 250

## 2020-11-29 MED ORDER — TRANEXAMIC ACID (OHS) PUMP PRIME SOLUTION
2.0000 mg/kg | INTRAVENOUS | Status: DC
  Filled 2020-11-29: qty 2

## 2020-11-29 MED ORDER — OXYCODONE HCL 5 MG PO TABS
5.0000 mg | ORAL_TABLET | ORAL | Status: DC | PRN
  Administered 2020-11-30 (×2): 10 mg via ORAL
  Filled 2020-11-29 (×2): qty 2

## 2020-11-29 MED ORDER — CHLORHEXIDINE GLUCONATE 0.12 % MT SOLN: 15.0000 mL | OROMUCOSAL | Status: AC

## 2020-11-29 MED ORDER — MORPHINE SULFATE (PF) 2 MG/ML IV SOLN: 2.0000 mg | INTRAVENOUS | Status: DC | PRN

## 2020-11-29 MED ORDER — PROTAMINE SULFATE 10 MG/ML IV SOLN
INTRAVENOUS | Status: AC
  Filled 2020-11-29: qty 25

## 2020-11-29 MED ORDER — MIDAZOLAM HCL 5 MG/5ML IJ SOLN
INTRAMUSCULAR | Status: DC | PRN
  Administered 2020-11-29: 2 mg via INTRAVENOUS
  Administered 2020-11-29: 3 mg via INTRAVENOUS

## 2020-11-29 MED ORDER — LIDOCAINE 2% (20 MG/ML) 5 ML SYRINGE
INTRAMUSCULAR | Status: AC
  Filled 2020-11-29: qty 5

## 2020-11-29 MED ORDER — POTASSIUM CHLORIDE 2 MEQ/ML IV SOLN
80.0000 meq | INTRAVENOUS | Status: DC
  Filled 2020-11-29: qty 40

## 2020-11-29 MED ORDER — NITROGLYCERIN IN D5W 200-5 MCG/ML-% IV SOLN: 0.0000 ug/min | INTRAVENOUS | Status: DC

## 2020-11-29 MED ORDER — HYDRALAZINE HCL 20 MG/ML IJ SOLN: 10.0000 mg | INTRAMUSCULAR | Status: DC | PRN

## 2020-11-29 SURGICAL SUPPLY — 85 items
BAG DECANTER FOR FLEXI CONT (MISCELLANEOUS) ×3 IMPLANT
BLADE CLIPPER SURG (BLADE) ×3 IMPLANT
BLADE STERNUM SYSTEM 6 (BLADE) ×3 IMPLANT
CANISTER SUCT 3000ML PPV (MISCELLANEOUS) ×3 IMPLANT
CATH ROBINSON RED A/P 18FR (CATHETERS) ×6 IMPLANT
CATH THORACIC 28FR (CATHETERS) ×3 IMPLANT
CATH THORACIC 36FR (CATHETERS) ×3 IMPLANT
CATH THORACIC 36FR RT ANG (CATHETERS) ×3 IMPLANT
CLIP VESOCCLUDE MED 24/CT (CLIP) IMPLANT
CLIP VESOCCLUDE SM WIDE 24/CT (CLIP) IMPLANT
CONTAINER PROTECT SURGISLUSH (MISCELLANEOUS) ×3 IMPLANT
DRAPE CARDIOVASCULAR INCISE (DRAPES) ×1
DRAPE SRG 135X102X78XABS (DRAPES) ×2 IMPLANT
DRAPE WARM FLUID 44X44 (DRAPES) ×3 IMPLANT
DRSG COVADERM 4X14 (GAUZE/BANDAGES/DRESSINGS) ×3 IMPLANT
ELECT BLADE 6.5 EXT (BLADE) ×3 IMPLANT
ELECT CAUTERY BLADE 6.4 (BLADE) ×3 IMPLANT
ELECT REM PT RETURN 9FT ADLT (ELECTROSURGICAL) ×6
ELECTRODE REM PT RTRN 9FT ADLT (ELECTROSURGICAL) ×4 IMPLANT
FELT TEFLON 1X6 (MISCELLANEOUS) ×3 IMPLANT
GAUZE SPONGE 4X4 12PLY STRL (GAUZE/BANDAGES/DRESSINGS) ×3 IMPLANT
GLOVE ORTHO TXT STRL SZ7.5 (GLOVE) IMPLANT
GLOVE SURG MICRO LTX SZ7 (GLOVE) ×6 IMPLANT
GLOVE SURG UNDER POLY LF SZ6.5 (GLOVE) IMPLANT
GLOVE SURG UNDER POLY LF SZ7 (GLOVE) IMPLANT
GOWN STRL REUS W/ TWL LRG LVL3 (GOWN DISPOSABLE) ×8 IMPLANT
GOWN STRL REUS W/ TWL XL LVL3 (GOWN DISPOSABLE) ×2 IMPLANT
GOWN STRL REUS W/TWL LRG LVL3 (GOWN DISPOSABLE) ×4
GOWN STRL REUS W/TWL XL LVL3 (GOWN DISPOSABLE) ×1
HEMOSTAT POWDER SURGIFOAM 1G (HEMOSTASIS) ×9 IMPLANT
HEMOSTAT SURGICEL 2X14 (HEMOSTASIS) ×3 IMPLANT
INSERT FOGARTY 61MM (MISCELLANEOUS) IMPLANT
INSERT FOGARTY XLG (MISCELLANEOUS) IMPLANT
KIT BASIN OR (CUSTOM PROCEDURE TRAY) ×3 IMPLANT
KIT CATH CPB BARTLE (MISCELLANEOUS) ×3 IMPLANT
KIT SUCTION CATH 14FR (SUCTIONS) ×3 IMPLANT
KIT TURNOVER KIT B (KITS) ×3 IMPLANT
NS IRRIG 1000ML POUR BTL (IV SOLUTION) ×15 IMPLANT
PACK E OPEN HEART (SUTURE) ×3 IMPLANT
PACK OPEN HEART (CUSTOM PROCEDURE TRAY) ×3 IMPLANT
PAD ARMBOARD 7.5X6 YLW CONV (MISCELLANEOUS) ×6 IMPLANT
PAD ELECT DEFIB RADIOL ZOLL (MISCELLANEOUS) ×3 IMPLANT
PENCIL BUTTON HOLSTER BLD 10FT (ELECTRODE) ×3 IMPLANT
POSITIONER HEAD DONUT 9IN (MISCELLANEOUS) ×3 IMPLANT
PUNCH AORTIC ROTATE 4.5MM 8IN (MISCELLANEOUS) ×3 IMPLANT
SET MPS 3-ND DEL (MISCELLANEOUS) ×3 IMPLANT
SPONGE INTESTINAL PEANUT (DISPOSABLE) IMPLANT
SPONGE LAP 18X18 RF (DISPOSABLE) IMPLANT
SPONGE LAP 4X18 RFD (DISPOSABLE) IMPLANT
SUPPORT HEART JANKE-BARRON (MISCELLANEOUS) ×3 IMPLANT
SUT BONE WAX W31G (SUTURE) ×3 IMPLANT
SUT MNCRL AB 4-0 PS2 18 (SUTURE) IMPLANT
SUT PROLENE 3 0 SH DA (SUTURE) IMPLANT
SUT PROLENE 3 0 SH1 36 (SUTURE) ×3 IMPLANT
SUT PROLENE 4 0 RB 1 (SUTURE)
SUT PROLENE 4 0 SH DA (SUTURE) IMPLANT
SUT PROLENE 4-0 RB1 .5 CRCL 36 (SUTURE) IMPLANT
SUT PROLENE 5 0 C 1 36 (SUTURE) IMPLANT
SUT PROLENE 6 0 C 1 30 (SUTURE) IMPLANT
SUT PROLENE 7 0 BV 1 (SUTURE) IMPLANT
SUT PROLENE 7 0 BV1 MDA (SUTURE) ×3 IMPLANT
SUT PROLENE 8 0 BV175 6 (SUTURE) IMPLANT
SUT SILK  1 MH (SUTURE)
SUT SILK 1 MH (SUTURE) IMPLANT
SUT SILK 2 0 SH (SUTURE) IMPLANT
SUT STEEL STERNAL CCS#1 18IN (SUTURE) IMPLANT
SUT STEEL SZ 6 DBL 3X14 BALL (SUTURE) IMPLANT
SUT VIC AB 1 CTX 36 (SUTURE) ×3
SUT VIC AB 1 CTX36XBRD ANBCTR (SUTURE) ×6 IMPLANT
SUT VIC AB 2-0 CT1 27 (SUTURE)
SUT VIC AB 2-0 CT1 TAPERPNT 27 (SUTURE) IMPLANT
SUT VIC AB 2-0 CTX 27 (SUTURE) IMPLANT
SUT VIC AB 3-0 SH 27 (SUTURE)
SUT VIC AB 3-0 SH 27X BRD (SUTURE) IMPLANT
SUT VIC AB 3-0 X1 27 (SUTURE) IMPLANT
SUT VICRYL 4-0 PS2 18IN ABS (SUTURE) IMPLANT
SYSTEM SAHARA CHEST DRAIN ATS (WOUND CARE) ×3 IMPLANT
TAPE CLOTH SURG 4X10 WHT LF (GAUZE/BANDAGES/DRESSINGS) ×3 IMPLANT
TAPE PAPER 2X10 WHT MICROPORE (GAUZE/BANDAGES/DRESSINGS) ×3 IMPLANT
TOWEL GREEN STERILE (TOWEL DISPOSABLE) ×3 IMPLANT
TOWEL GREEN STERILE FF (TOWEL DISPOSABLE) ×3 IMPLANT
TRAY FOLEY SLVR 16FR TEMP STAT (SET/KITS/TRAYS/PACK) ×3 IMPLANT
TUBING LAP HI FLOW INSUFFLATIO (TUBING) ×3 IMPLANT
UNDERPAD 30X36 HEAVY ABSORB (UNDERPADS AND DIAPERS) ×3 IMPLANT
WATER STERILE IRR 1000ML POUR (IV SOLUTION) ×6 IMPLANT

## 2020-11-29 SURGICAL SUPPLY — 13 items
CATH INFINITI 5FR ANG PIGTAIL (CATHETERS) ×2 IMPLANT
CATH OPTITORQUE TIG 4.0 5F (CATHETERS) ×2 IMPLANT
DEVICE RAD COMP TR BAND LRG (VASCULAR PRODUCTS) ×2 IMPLANT
ELECT DEFIB PAD ADLT CADENCE (PAD) ×2 IMPLANT
GLIDESHEATH SLEND A-KIT 6F 22G (SHEATH) ×2 IMPLANT
GUIDEWIRE INQWIRE 1.5J.035X260 (WIRE) ×1 IMPLANT
INQWIRE 1.5J .035X260CM (WIRE) ×2
KIT ENCORE 26 ADVANTAGE (KITS) ×2 IMPLANT
KIT HEART LEFT (KITS) ×2 IMPLANT
PACK CARDIAC CATHETERIZATION (CUSTOM PROCEDURE TRAY) ×2 IMPLANT
TRANSDUCER W/STOPCOCK (MISCELLANEOUS) ×2 IMPLANT
TUBING CIL FLEX 10 FLL-RA (TUBING) ×2 IMPLANT
WIRE HI TORQ VERSACORE-J 145CM (WIRE) ×2 IMPLANT

## 2020-11-29 NOTE — Anesthesia Procedure Notes (Signed)
Central Venous Catheter Insertion Performed by: Atilano Median, DO, anesthesiologist Start/End5/21/2022 3:11 PM, 11/29/2020 3:13 PM Patient location: Pre-op. Preanesthetic checklist: patient identified, IV checked, site marked, risks and benefits discussed, surgical consent, monitors and equipment checked, pre-op evaluation, timeout performed and anesthesia consent Position: supine Hand hygiene performed  and maximum sterile barriers used  PA cath was placed.Swan type:thermodilution PA Cath depth:50 Procedure performed without using ultrasound guided technique. Attempts: 1 Patient tolerated the procedure well with no immediate complications.

## 2020-11-29 NOTE — Op Note (Signed)
CARDIOVASCULAR SURGERY OPERATIVE NOTE  11/29/2020  Surgeon:  Alleen Borne, MD  First Assistant: Jillyn Hidden,  PA-C   Preoperative Diagnosis:  Severe single-vessel coronary artery disease   Postoperative Diagnosis:  Same   Procedure:  1. Emergent Median Sternotomy 2. Extracorporeal circulation 3.   Coronary artery bypass grafting x 1   Left internal mammary artery graft to the LAD    Anesthesia:  General Endotracheal   Clinical History/Surgical Indication:  This 50 year old gentleman has a high-grade ostial LAD stenosis due to ruptured plaque presenting with an inferior STEMI due to a large LAD wraparound apex.  I agree that the best treatment for this patient is a left internal mammary graft to LAD.  Given the appearance of this lesion I think this needs to be done emergently.  He has minimal chest discomfort at this time and had correction of his EKG changes. I discussed the operative procedure with the patient and his wife including alternatives, benefits and risks; including but not limited to bleeding, blood transfusion, infection, stroke, myocardial infarction, graft failure, heart block requiring a permanent pacemaker, organ dysfunction, and death.  Allen Walton understands and agrees to proceed.    Preparation:  The patient was seen in the preoperative holding area and the correct patient, correct operation were confirmed with the patient after reviewing the medical record and catheterization. The consent was signed by me. Preoperative antibiotics were given. A pulmonary arterial line and radial arterial line were placed by the anesthesia team. The patient was taken back to the operating room and positioned supine on the operating room table. After being placed under general endotracheal anesthesia by the anesthesia team a foley catheter was placed. The neck, chest, abdomen,  and both legs were prepped with betadine soap and solution and draped in the usual sterile manner. A surgical time-out was taken and the correct patient and operative procedure were confirmed with the nursing and anesthesia staff.   Cardiopulmonary Bypass:  A median sternotomy was performed. The pericardium was opened in the midline. Right ventricular function appeared normal. The ascending aorta was of normal size and had no palpable plaque. There were no contraindications to aortic cannulation or cross-clamping. The patient was fully systemically heparinized and the ACT was maintained > 400 sec. The proximal aortic arch was cannulated with a 20 F aortic cannula for arterial inflow. Venous cannulation was performed via the right atrial appendage using a two-staged venous cannula. An antegrade cardioplegia/vent cannula was inserted into the mid-ascending aorta. Aortic occlusion was performed with a single cross-clamp. The patient was kept warm at 37 degrees and topical cooling of the heart with iced saline were used. Hyperkalemic antegrade cold blood cardioplegia was used to induce diastolic arrest. A temperature probe was inserted into the interventricular septum and an insulating pad was placed in the pericardium.   Left internal mammary artery harvest:  The left side of the sternum was retracted using the Rultract retractor. The left internal mammary artery was harvested as a pedicle graft. All side branches were clipped. It was a medium-sized vessel of good quality with excellent blood flow. It was ligated distally and divided. It was sprayed with topical papaverine solution to prevent vasospasm.    Coronary arteries:  The coronary arteries were examined.   LAD:  Large vessel with no distal disease  LCX:  OM large with no distal disease  RCA:  Moderate sized PDA and PL with no distal disease.   Grafts:  1. LIMA to the LAD: 3.0  mm. It was sewn end to side using 8-0 prolene continuous  suture.    Completion:  The patient was actively rewarmed to 37 degrees Centigrade throughout the bypass period. The clamp was removed from the LIMA pedicle and there was rapid warming of the septum and return of ventricular fibrillation. The crossclamp was removed with a time of 21 minutes. There was spontaneous return of sinus rhythm. The distal and proximal anastomoses were checked for hemostasis. The position of the graft was satisfactory. Two temporary epicardial pacing wires were placed on the right atrium and two on the right ventricle. The patient was weaned from CPB without difficulty on no inotropes. CPB time was 31 minutes. Cardiac output was 5 LPM. TEE showed normal LV systolic function. Heparin was fully reversed with protamine and the aortic and venous cannulas removed. Hemostasis was achieved. Mediastinal and left pleural drainage tubes were placed. The sternum was closed with double #6 stainless steel wires. The fascia was closed with continuous # 1 vicryl suture. The subcutaneous tissue was closed with 2-0 vicryl continuous suture. The skin was closed with 3-0 vicryl subcuticular suture. All sponge, needle, and instrument counts were reported correct at the end of the case. Dry sterile dressings were placed over the incisions and around the chest tubes which were connected to pleurevac suction. The patient was then transported to the surgical intensive care unit in stable condition.

## 2020-11-29 NOTE — Procedures (Signed)
Extubation Procedure Note  Patient Details:   Name: Allen Walton DOB: March 04, 1971 MRN: [id number]   Airway Documentation:    Vent end date: 11/29/20 Vent end time: 2340   Evaluation  O2 sats: stable throughout Complications: No apparent complications Patient did tolerate procedure well. Bilateral Breath Sounds: Clear,Diminished   Yes   Pt tolerated rapid wean protocol, achieved 1.0L VC and -20 NIF, positive cuff leak, extubated to 4L [place]. No stridor or dyspnea noted after extubation.   Armando Gang 11/29/2020, 11:56 PM

## 2020-11-29 NOTE — Progress Notes (Signed)
Patient taken to Pre-op 36, report given to cathy. Belongings, including phone and clothes, given to patient's wife [place] who is in [facility] waiting, glasses remained with patient. Notified receiving nurse to have [facility] notified when radial sheath needs removal for our manager on call to come place TR band.

## 2020-11-29 NOTE — Anesthesia Postprocedure Evaluation (Signed)
Anesthesia Post Note  Patient: Allen Walton  Procedure(s) Performed: CORONARY ARTERY BYPASS GRAFTING (CABG) TIMES ONE, ON PUMP, USING LEFT INTERNAL MAMMARY ARTERY (N/A Chest) TRANSESOPHAGEAL ECHOCARDIOGRAM (TEE) (N/A )     Patient location during evaluation: SICU Anesthesia Type: General Level of consciousness: patient remains intubated per anesthesia plan Pain management: pain level controlled Vital Signs Assessment: post-procedure vital signs reviewed and stable Respiratory status: patient remains intubated per anesthesia plan Cardiovascular status: stable Postop Assessment: no apparent nausea or vomiting Anesthetic complications: no   No complications documented.  Last Vitals:  Vitals:   11/29/20 1258 11/29/20 1845  Resp:  12  SpO2: 100% 100%    Last Pain:  Vitals:   11/29/20 1352  PainSc: 0-No pain                 Jaymen Fetch

## 2020-11-29 NOTE — Consult Note (Signed)
[address]       [place] [address]             [phone]      Cardiothoracic Surgery Consultation  Reason for Consult: High grade ostial LAD stenosis with STEMI Referring Physician: Dr. Shela Commons. Allyson Sabal  Lew Prout is an 50 y.o. male.  HPI:   The patient is a 50 year old gentleman with history of diabetes and 1/2 pack/day smoking who developed onset of substernal chest pain around 8 AM this morning after breakfast.  The pain felt like heartburn and was radiating to his left arm with associated shortness of breath.  He was seen at [facility] emergency department and found to have an elevated troponin and electrocardiogram showing ST elevation in the inferior leads and V4 to V6 consistent with STEMI.  He was transferred to [facility] and taken the Cath Lab by Dr. Allyson Sabal showing a ruptured plaque in the ostium of the LAD just beyond the left main with TIMI II flow.  The degree of stenosis was estimated at 95%.  Left ventricular function was preserved.  The patient had minimal residual pain in the Cath Lab.  Dr. Allyson Sabal and I felt the best option was to take the patient the operating room emergently for a left internal mammary graft to the LAD.  Past medical history:  Diabetes with his hemoglobin A1c being 7.6 on metformin and glipizide at home  Family history:   Sister had coronary disease  Social History: He is married and works at [employer] in [employer].  He smokes 1/2 pack of cigarettes per day.  Allergies: No Known Allergies  Medications:  I have reviewed the patient's current medications. Prior to Admission:  No medications prior to admission.   Scheduled: . epinephrine  0-10 mcg/min Intravenous To OR  . insulin   Intravenous To OR  . magnesium sulfate  40 mEq Other To OR  . phenylephrine  30-200 mcg/min Intravenous To OR  . potassium chloride  80 mEq Other To OR  . tranexamic acid  15 mg/kg Intravenous To OR  . tranexamic acid  2 mg/kg  Intracatheter To OR   Continuous: .  ceFAZolin (ANCEF) IV    .  ceFAZolin (ANCEF) IV    . dexmedetomidine    . heparin 30,000 units/NS 1000 mL solution for CELLSAVER    . milrinone    . nitroGLYCERIN    . norepinephrine    . tranexamic acid (CYKLOKAPRON) infusion ([facility])    . vancomycin     PRN: Anti-infectives (From admission, onward)   Start     Dose/Rate Route Frequency Ordered Stop   11/29/20 1345  ceFAZolin (ANCEF) IVPB 2g/100 mL premix        2 g 200 mL/hr over 30 Minutes Intravenous To Surgery 11/29/20 1341 11/30/20 1345   11/29/20 1345  ceFAZolin (ANCEF) IVPB 2g/100 mL premix        2 g 200 mL/hr over 30 Minutes Intravenous To Surgery 11/29/20 1341 11/30/20 1345   11/29/20 1345  vancomycin (VANCOREADY) IVPB 1500 mg/300 mL        1,500 mg 150 mL/hr over 120 Minutes Intravenous To Surgery 11/29/20 1341 11/30/20 1345      Results for orders placed or performed during the hospital encounter of 11/29/20 (from the past 48 hour(s))  Resp Panel by RT-PCR (Flu A&B, Covid) Nasopharyngeal Swab     Status: None   Collection Time: 11/29/20 12:42 PM   Specimen:  Nasopharyngeal Swab; Nasopharyngeal(NP) swabs in vial transport medium  Result Value Ref Range   SARS Coronavirus 2 by RT PCR NEGATIVE NEGATIVE    Comment: (NOTE) SARS-CoV-2 target nucleic acids are NOT DETECTED.  The SARS-CoV-2 RNA is generally detectable in upper respiratory specimens during the acute phase of infection. The lowest concentration of SARS-CoV-2 viral copies this assay can detect is 138 copies/mL. A negative result does not preclude SARS-Cov-2 infection and should not be used as the sole basis for treatment or other patient management decisions. A negative result may occur with  improper specimen collection/handling, submission of specimen other than nasopharyngeal swab, presence of viral mutation(s) within the areas targeted by this assay, and inadequate number of viral copies(<138 copies/mL). A negative  result must be combined with clinical observations, patient history, and epidemiological information. The expected result is Negative.  Fact Sheet for Patients:  BloggerCourse.com  Fact Sheet for Healthcare Providers:  SeriousBroker.it  This test is no t yet approved or cleared by the [place] FDA and  has been authorized for detection and/or diagnosis of SARS-CoV-2 by FDA under an Emergency Use Authorization (EUA). This EUA will remain  in effect (meaning this test can be used) for the duration of the COVID-19 declaration under Section 564(b)(1) of the Act, 21 U.S.C.section 360bbb-3(b)(1), unless the authorization is terminated  or revoked sooner.       Influenza A by PCR NEGATIVE NEGATIVE   Influenza B by PCR NEGATIVE NEGATIVE    Comment: (NOTE) The Xpert Xpress SARS-CoV-2/FLU/RSV plus assay is intended as an aid in the diagnosis of influenza from Nasopharyngeal swab specimens and should not be used as a sole basis for treatment. Nasal washings and aspirates are unacceptable for Xpert Xpress SARS-CoV-2/FLU/RSV testing.  Fact Sheet for Patients: BloggerCourse.com  Fact Sheet for Healthcare Providers: SeriousBroker.it  This test is not yet approved or cleared by the [place] FDA and has been authorized for detection and/or diagnosis of SARS-CoV-2 by FDA under an Emergency Use Authorization (EUA). This EUA will remain in effect (meaning this test can be used) for the duration of the COVID-19 declaration under Section 564(b)(1) of the Act, 21 U.S.C. section 360bbb-3(b)(1), unless the authorization is terminated or revoked.  Performed at [facility], [address], [place], [place] [address]   I-STAT, [place] [address]     Status: Abnormal   Collection Time: 11/29/20  1:03 PM  Result Value Ref Range   Sodium 141 135 - 145 mmol/L   Potassium 4.6 3.5 - 5.1 mmol/L    Chloride 108 98 - 111 mmol/L   BUN 17 6 - 20 mg/dL   Creatinine, Ser 6.04 0.61 - 1.24 mg/dL   Glucose, Bld 540 (H) 70 - 99 mg/dL    Comment: Glucose reference range applies only to samples taken after fasting for at least 8 hours.   Calcium, Ion 1.23 1.15 - 1.40 mmol/L   TCO2 22 22 - 32 mmol/L   Hemoglobin 12.9 (L) 13.0 - 17.0 g/dL   HCT 98.1 (L) 19.1 - 47.8 %  POCT Activated clotting time     Status: None   Collection Time: 11/29/20  1:25 PM  Result Value Ref Range   Activated Clotting Time 297 seconds    CARDIAC CATHETERIZATION  Result Date: 11/29/2020  Ost LAD to Prox LAD lesion is 95% stenosed.  Adetokunbo Mccadden is a 50 y.o. male  [id number] LOCATION:  FACILITY: [facility] PHYSICIAN: Nanetta Batty, M.D. Nov 20, 1970 DATE OF PROCEDURE:  11/29/2020 DATE OF  DISCHARGE: CARDIAC CATHETERIZATION History obtained from chart review.  Mr. Rosenberg is a 50 year old mildly overweight married African-American male without prior cardiac history.  He does have a history of tobacco abuse, hypertension, hyperlipidemia and diabetes.  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  In the ER he was found to have inferolateral ST segment elevation.  He was treated with IV heparin and aspirin and was transported urgently to [facility] for cardiac catheterization and potential intervention. PROCEDURE DESCRIPTION: The patient was brought to the second floor [facility]iac cath lab in the postabsorptive state. He was not premedicated . His right wrist was prepped and shaved in usual sterile fashion. Xylocaine 1% was used for local anesthesia. A 6 French sheath was inserted into the right radial artery using standard Seldinger technique. The patient received 10,000 units  of heparin intravenously.  A 5 [place] TIG catheter and pigtail catheters were used for selective coronary angiography and left ventriculography respectively.  Isovue dye is used for the  entirety of the case (75 cc of contrast administered patient).  Retrograde aortic, ventricular and pullback pressures were recorded.  Radial cocktail was administered via the SideArm sheath.  ACT was measured to 97.   Mr. Groninger has a ruptured plaque in the ostial LAD just off the left main and a huge vessel.  He has TIMI II flow.  The remainder of his coronary nanometer is free of significant disease.  He has preserved LV function with mild apical hypokinesia.  I believe the best revascularization option would be CABG with a LIMA to his LAD.  I have asked Dr. Laneta Simmers to review and he agrees.  We will keep the patient on a heparin drip.  He did receive aspirin.  He is hemodynamically stable with a blood pressure in the 120/80 range in sinus rhythm.  He is currently almost pain-free.  We talked about risk factor modification including smoking cessation. Nanetta Batty. MD, [facility] 11/29/2020 1:52 PM    Review of Systems  Constitutional: Negative for diaphoresis and fatigue.  HENT: Negative.   Eyes: Negative.   Respiratory: Positive for shortness of breath.   Cardiovascular: Positive for chest pain. Negative for leg swelling.  Gastrointestinal: Negative.   Endocrine: Negative.   Genitourinary: Negative.   Musculoskeletal: Negative.   Allergic/Immunologic: Negative.   Neurological: Negative for dizziness and syncope.  Hematological: Negative.   Psychiatric/Behavioral: Negative.    Height 6\' 1"  (1.854 m), weight 99.8 kg, SpO2 100 %. Physical Exam Constitutional:      Appearance: Normal appearance. He is normal weight.  Eyes:     Extraocular Movements: Extraocular movements intact.     Pupils: Pupils are equal, round, and reactive to light.  Cardiovascular:     Rate and Rhythm: Normal rate and regular rhythm.     Heart sounds: Normal heart sounds. No murmur heard.   Pulmonary:     Effort: Pulmonary effort is normal.     Breath sounds: Normal breath sounds.  Musculoskeletal:        General:  No swelling.  Skin:    General: Skin is warm and dry.  Neurological:     General: No focal deficit present.     Mental Status: He is alert and oriented to person, place, and time.  Psychiatric:        Mood and Affect: Mood normal.        Behavior: Behavior normal.  Physicians  Panel Physicians Referring Physician Case Authorizing Physician  Runell Gess, MD (Primary)      Procedures  CORONARY/GRAFT ACUTE MI REVASCULARIZATION   Conclusion    Ost LAD to Prox LAD lesion is 95% stenosed.   Roczen Waymire is a 50 y.o. male    [id number] LOCATION:  FACILITY: [facility]  PHYSICIAN: Nanetta Batty, M.D. 1971-02-25   DATE OF PROCEDURE:  11/29/2020  DATE OF DISCHARGE:     CARDIAC CATHETERIZATION     History obtained from chart review.  Mr. Sedler is a 50 year old mildly overweight married African-American male without prior cardiac history.  He does have a history of tobacco abuse, hypertension, hyperlipidemia and diabetes.  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  In the ER he was found to have inferolateral ST segment elevation.  He was treated with IV heparin and aspirin and was transported urgently to [facility] for cardiac catheterization and potential intervention.   PROCEDURE DESCRIPTION:   The patient was brought to the second floor [facility]iac cath lab in the postabsorptive state. He was not premedicated . His right wrist was prepped and shaved in usual sterile fashion. Xylocaine 1% was used for local anesthesia. A 6 French sheath was inserted into the right radial artery using standard Seldinger technique. The patient received 10,000 units  of heparin intravenously.  A 5 [place] TIG catheter and pigtail catheters were used for selective coronary angiography and left ventriculography respectively.  Isovue dye is used for the entirety of the case (75 cc of contrast  administered patient).  Retrograde aortic, ventricular and pullback pressures were recorded.  Radial cocktail was administered via the SideArm sheath.  ACT was measured to 97.   IMPRESSION: Mr. Kochan has a ruptured plaque in the ostial LAD just off the left main and a huge vessel.  He has TIMI II flow.  The remainder of his coronary nanometer is free of significant disease.  He has preserved LV function with mild apical hypokinesia.  I believe the best revascularization option would be CABG with a LIMA to his LAD.  I have asked Dr. Laneta Simmers to review and he agrees.  We will keep the patient on a heparin drip.  He did receive aspirin.  He is hemodynamically stable with a blood pressure in the 120/80 range in sinus rhythm.  He is currently almost pain-free.  We talked about risk factor modification including smoking cessation.  Nanetta Batty. MD, [facility] 11/29/2020 1:52 PM       Recommendations  Antiplatelet/Anticoag Recommend Aspirin 81mg  daily for moderate CAD.   Indications  Acute ST elevation myocardial infarction (STEMI) of inferolateral wall (HCC) [I21.19 (ICD-10-CM)]   Procedural Details  Technical Details Estimated blood loss <50 mL.   During this procedure no sedation was administered.   Medications (Filter: Administrations occurring from 1243 to 1402 on 11/29/20) (important) Continuous medications are totaled by the amount administered until 11/29/20 1402.    lidocaine (PF) (XYLOCAINE) 1 % injection (mL) Total volume:  2 mL  Date/Time Rate/Dose/Volume Action   11/29/20 1303 2 mL Given    Radial Cocktail (Verapamil 5 mg, NTG, Lidocaine) Total dose:  Cannot be calculated*  *Administration dose not documented Date/Time Rate/Dose/Volume Action   11/29/20 1303  Given    Heparin (Porcine) in NaCl 1000-0.9 UT/500ML-% SOLN (mL) Total volume:  1,000 mL  Date/Time Rate/Dose/Volume Action   11/29/20 1303 500 mL Given  1303 500 mL Given    heparin sodium  (porcine) injection (Units) Total dose:  10,000 Units  Date/Time Rate/Dose/Volume Action   11/29/20 1303 10,000 Units Given    iohexol (OMNIPAQUE) 350 MG/ML injection (mL) Total volume:  75 mL  Date/Time Rate/Dose/Volume Action   11/29/20 1350 75 mL Given    heparin sodium (porcine) 2,500 Units, papaverine 30 mg in electrolyte-148 (PLASMALYTE-148) 500 mL irrigation (mL) Total volume:  14 mL Dosing weight:  99.8  Date/Time Rate/Dose/Volume Action   11/29/20 1352 14 mL Given    Contrast  Medication Name Total Dose  iohexol (OMNIPAQUE) 350 MG/ML injection 75 mL    Radiation/Fluoro  Fluoro time: 4.2 (min) DAP: 24929 (mGycm2) Cumulative Air Kerma: 418 (mGy)   Coronary Findings   Diagnostic Dominance: Right  Left Anterior Descending  Ost LAD to Prox LAD lesion is 95% stenosed. Vessel is the culprit lesion. The lesion is thrombotic and ulcerative. The lesion was not previously treated.   Intervention   No interventions have been documented.  Coronary Diagrams   Diagnostic Dominance: Right     Assessment/Plan:  This 50 year old gentleman has a high-grade ostial LAD stenosis due to ruptured plaque presenting with an inferior STEMI due to a large LAD wraparound apex.  I agree that the best treatment for this patient is a left internal mammary graft to LAD.  Given the appearance of this lesion I think this needs to be done emergently.  He has minimal chest discomfort at this time and had correction of his EKG changes. I discussed the operative procedure with the patient and his wife including alternatives, benefits and risks; including but not limited to bleeding, blood transfusion, infection, stroke, myocardial infarction, graft failure, heart block requiring a permanent pacemaker, organ dysfunction, and death.  Allyn KennerHarry Pat understands and agrees to proceed.    Payton DoughtyBryan K Anastashia Westerfeld 11/29/2020, 2:09 PM

## 2020-11-29 NOTE — Anesthesia Procedure Notes (Signed)
Procedure Name: Intubation Date/Time: 11/29/2020 2:52 PM Performed by: Rande Brunt, CRNA Pre-anesthesia Checklist: Patient identified, Emergency Drugs available, Suction available and Patient being monitored Patient Re-evaluated:Patient Re-evaluated prior to induction Oxygen Delivery Method: Circle System Utilized Preoxygenation: Pre-oxygenation with 100% oxygen Induction Type: IV induction Ventilation: Mask ventilation without difficulty Laryngoscope Size: Mac and 4 Grade View: Grade II Tube type: Oral Tube size: 7.5 mm Number of attempts: 2 Airway Equipment and Method: Stylet and Oral airway Placement Confirmation: ETT inserted through vocal cords under direct vision,  positive ETCO2 and breath sounds checked- equal and bilateral Secured at: 22 cm Tube secured with: Tape Dental Injury: Teeth and Oropharynx as per pre-operative assessment  Comments: Attempted by CRNA A. Jayshawn Colston with grade 2b view, Judeth Cornfield CRNA grade 2 view

## 2020-11-29 NOTE — Anesthesia Preprocedure Evaluation (Addendum)
Anesthesia Evaluation  Patient identified by MRN, date of birth, ID band Patient awake    Reviewed: Allergy & Precautions, NPO status , Patient's Chart, lab work & pertinent test results  Airway Mallampati: II  TM Distance: >3 FB Neck ROM: Full    Dental  (+) Teeth Intact   Pulmonary neg pulmonary ROS, Current Smoker and Patient abstained from smoking.,    Pulmonary exam normal        Cardiovascular + angina at rest + Past MI   Rhythm:Regular Rate:Normal     Neuro/Psych negative neurological ROS  negative psych ROS   GI/Hepatic negative GI ROS, Neg liver ROS,   Endo/Other  diabetes, Type 2, Oral Hypoglycemic Agents  Renal/GU negative Renal ROS  negative genitourinary   Musculoskeletal negative musculoskeletal ROS (+)   Abdominal (+)  Abdomen: soft. Bowel sounds: normal.  Peds  Hematology negative hematology ROS (+)   Anesthesia Other Findings   Reproductive/Obstetrics                             Anesthesia Physical Anesthesia Plan  ASA: IV and emergent  Anesthesia Plan: General   Post-op Pain Management:    Induction: Intravenous  PONV Risk Score and Plan: 1 and Midazolam and Treatment may vary due to age or medical condition  Airway Management Planned: Mask and Oral ETT  Additional Equipment: Arterial line, CVP, PA Cath, TEE, 3D TEE and Ultrasound Guidance Line Placement  Intra-op Plan:   Post-operative Plan: Post-operative intubation/ventilation  Informed Consent: I have reviewed the patients History and Physical, chart, labs and discussed the procedure including the risks, benefits and alternatives for the proposed anesthesia with the patient or authorized representative who has indicated his/her understanding and acceptance.     Dental advisory given  Plan Discussed with: CRNA  Anesthesia Plan Comments: (Cath 11/29/20: - Ostial to mid LAD 95% stenosis Mr. Franchino  has a ruptured plaque in the ostial LAD just off the left main and a huge vessel.  He has TIMI II flow.  The remainder of his coronary nanometer is free of significant disease.  He has preserved LV function with mild apical hypokinesia.  I believe the best revascularization option would be CABG with a LIMA to his LAD.  I have asked Dr. Laneta Simmers to review and he agrees.  We will keep the patient on a heparin drip.  He did receive aspirin.  He is hemodynamically stable with a blood pressure in the 120/80 range in sinus rhythm.  He is currently almost pain-free.  We talked about risk factor modification including smoking cessation.   Lab Results      Component                Value               Date                      WBC                      7.6                 11/29/2020                HGB                      11.6 (L)  11/29/2020                HCT                      34.0 (L)            11/29/2020                MCV                      74.0 (L)            11/29/2020                PLT                      303                 11/29/2020           Lab Results      Component                Value               Date                      NA                       141                 11/29/2020                K                        4.4                 11/29/2020                CO2                      23                  11/29/2020                GLUCOSE                  109 (H)             11/29/2020                BUN                      16                  11/29/2020                CREATININE               0.90                11/29/2020                CALCIUM                  8.4 (L)             11/29/2020                GFRNONAA                 >  60                 11/29/2020          )        Anesthesia Quick Evaluation

## 2020-11-29 NOTE — Progress Notes (Signed)
  Echocardiogram Echocardiogram Transesophageal has been performed.  [name] 11/29/2020, 6:15 PM

## 2020-11-29 NOTE — Brief Op Note (Signed)
11/29/2020  5:18 PM  PATIENT:  Allen Walton  50 y.o. male  PRE-OPERATIVE DIAGNOSIS:  Coronary Artery Disease, Acute STEMI  POST-OPERATIVE DIAGNOSIS:  Coronary Artery Disease, Acute STEMI  PROCEDURE:   CORONARY ARTERY BYPASS GRAFTING (CABG) TIMES ONE, ON PUMP  LEFT INTERNAL MAMMARY ARTERY   LIMA->LAD TRANSESOPHAGEAL ECHOCARDIOGRAM (TEE)  SURGEON:  Alleen Borne, MD - Primary  PHYSICIAN ASSISTANT: Abem Shaddix  ASSISTANTSCheral Marker, RNFA  ANESTHESIA:   general  EBL:  Per anesthesia and perfusion records   BLOOD ADMINISTERED:none  DRAINS: Left pleural and mediastinal tubes   LOCAL MEDICATIONS USED:  NONE  SPECIMEN:  No Specimen  DISPOSITION OF SPECIMEN:  N/A  COUNTS:  CORRECT  DICTATION: .Dragon Dictation  PLAN OF CARE: Admit to inpatient   PATIENT DISPOSITION:  ICU - intubated and hemodynamically stable.   Delay start of Pharmacological VTE agent (>24hrs) due to surgical blood loss or risk of bleeding: yes

## 2020-11-29 NOTE — Transfer of Care (Signed)
Immediate Anesthesia Transfer of Care Note  Patient: Allen Walton  Procedure(s) Performed: CORONARY ARTERY BYPASS GRAFTING (CABG) TIMES ONE, ON PUMP, USING LEFT INTERNAL MAMMARY ARTERY (N/A Chest) TRANSESOPHAGEAL ECHOCARDIOGRAM (TEE) (N/A )  Patient Location: SICU  Anesthesia Type:General  Level of Consciousness: Patient remains intubated per anesthesia plan  Airway & Oxygen Therapy: Patient remains intubated per anesthesia plan and Patient placed on Ventilator (see vital sign flow sheet for setting)  Post-op Assessment: Report given to RN and Post -op Vital signs reviewed and stable  Post vital signs: Reviewed and stable  Last Vitals:  Vitals Value Taken Time  BP 128/65   Temp    Pulse 80   Resp 12 11/29/20 1845  SpO2 100 % 11/29/20 1845    Last Pain:  Vitals:   11/29/20 1352  PainSc: 0-No pain         Complications: No complications documented.

## 2020-11-29 NOTE — Anesthesia Procedure Notes (Signed)
Central Venous Catheter Insertion Performed by: Atilano Median, DO, anesthesiologist Start/End5/21/2022 3:00 PM, 11/29/2020 3:10 PM Patient location: OR. Preanesthetic checklist: patient identified, IV checked, site marked, risks and benefits discussed, surgical consent, monitors and equipment checked, pre-op evaluation, timeout performed and anesthesia consent Position: Trendelenburg Lidocaine 1% used for infiltration and patient sedated Hand hygiene performed  and maximum sterile barriers used  Catheter size: 8.5 Fr Sheath introducer Procedure performed using ultrasound guided technique. Ultrasound Notes:anatomy identified, needle tip was noted to be adjacent to the nerve/plexus identified, no ultrasound evidence of intravascular and/or intraneural injection and image(s) printed for medical record Attempts: 1 Following insertion, line sutured, dressing applied and Biopatch. Post procedure assessment: blood return through all ports, free fluid flow and no air  Patient tolerated the procedure well with no immediate complications.

## 2020-11-29 NOTE — H&P (Addendum)
Cardiology Admission History and Physical:   Patient ID: Allen Walton MRN: [id number]; DOB: 01/07/1971   Admission date: 11/29/2020  PCP:  No primary care provider on file.   [facility] Providers Cardiologist:  New to Dr Allyson Sabal   Chief Complaint:  Chest pain   Patient Profile:   Allen Walton is a 50 y.o. male with self-reported past medical history of diabetes who is being seen 11/29/2020 for the evaluation of chest pain.   History of Present Illness:   Allen Walton is interviewed on the way to Cath Lab during STEMI code upon arrival to [facility] from [place].  He is alert and oriented, able to carry conversation appropriately.   He reports he has a history of diabetes, and hemoglobin A1c improved from 12 to 7.6%, is taking metformin and glipizide at home.   He denies known history of cardiac disease including MI/CAD, history of cardiac stents or CABG. He started experiencing midsternal chest pain around 8 AM this morning after breakfast.  He states pain feels like burning sensation mimicking heartburn.  Pain was radiating to left arm. He reports associated shortness of breath.  He denied any nausea, vomiting, paresthesia.  He was seen at [facility] ED, found to have elevated troponin, EKG revealed ST elevation of inferior leads and V4 to V6, subsequently transferred here for STEMI.   He reports mild chest pain at current time, the symptom is overall improved.   He reports tobacco use, denied alcohol use.  He reports his sister had coronary blockages in the past.  He denies any known allergy.    Medications Prior to Admission: Prior to Admission medications   Not on File     Allergies:   Not on File  Social History:   Social History   Socioeconomic History   Marital status: Not on file    Spouse name: Not on file   Number of children: Not on file   Years of education: Not on file   Highest education level: Not on file  Occupational History   Not on file  Tobacco Use    Smoking status: Not on file   Smokeless tobacco: Not on file  Substance and Sexual Activity   Alcohol use: Not on file   Drug use: Not on file   Sexual activity: Not on file  Other Topics Concern   Not on file  Social History Narrative   Not on file   Social Determinants of Health   Financial Resource Strain: Not on file  Food Insecurity: Not on file  Transportation Needs: Not on file  Physical Activity: Not on file  Stress: Not on file  Social Connections: Not on file  Intimate Partner Violence: Not on file    Family History:   Sister: CAD  ROS:  ROS is limited to CV as this is emergent STEMI encounter      Physical Exam/Data:   Physical exam by MD, limited time to perform PE   EKG:  The ECG that was done on 11/29/2020 at 1206 was personally reviewed and demonstrates sinus rhythm with ventricular rate of 96 bpm, ST elevation noted of inferior leads and V4 through V6.   Relevant CV Studies: N/A  Laboratory Data:  High Sensitivity Troponin:  No results for input(s): TROPONINIHS in the last 720 hours.    ChemistryNo results for input(s): NA, K, CL, CO2, GLUCOSE, BUN, CREATININE, CALCIUM, GFRNONAA, GFRAA, ANIONGAP in the last 168 hours.  No results for input(s): PROT, ALBUMIN, AST, ALT, ALKPHOS,  BILITOT in the last 168 hours. HematologyNo results for input(s): WBC, RBC, HGB, HCT, MCV, MCH, MCHC, RDW, PLT in the last 168 hours. BNPNo results for input(s): BNP, PROBNP in the last 168 hours.  DDimer No results for input(s): DDIMER in the last 168 hours.   Radiology/Studies:  No results found.   Assessment and Plan:   Inferior STEMI -Presented with chest pain starting at 8 AM after breakfast pressure with shortness of breath radiating to left arm, pain is overall improved upon arrival -Code STEMI called, transferred emergently for cardiac cath -will check CBC, CMP, Mag, Trop, INR, lipid panel, A1C - will check Echo -medical management pending cath finding  DM -  will check A1C - will need hold metformin and glipizide in house, insulin SSI AC  - [facility] diet    Risk Assessment/Risk Scores:   TIMI Risk Score for ST  Elevation MI:   The patient's TIMI risk score is 2, which indicates a 2.2% risk of all cause mortality at 30 days. {     Severity of Illness: The appropriate patient status for this patient is INPATIENT. Inpatient status is judged to be reasonable and necessary in order to provide the required intensity of service to ensure the patient's safety. The patient's presenting symptoms, physical exam findings, and initial radiographic and laboratory data in the context of their chronic comorbidities is felt to place them at high risk for further clinical deterioration. Furthermore, it is not anticipated that the patient will be medically stable for discharge from the hospital within 2 midnights of admission. The following factors support the patient status of inpatient.   " The patient's presenting symptoms include chest pain. " The worrisome physical exam findings include chest pain. " The initial radiographic and laboratory data are worrisome because of STEMI. " The chronic co-morbidities include DM.   * I certify that at the point of admission it is my clinical judgment that the patient will require inpatient hospital care spanning beyond 2 midnights from the point of admission due to high intensity of service, high risk for further deterioration and high frequency of surveillance required.*    For questions or updates, please contact [facility] Please consult [email or web address] for contact info under     Signed, Cyndi Bender, NP  11/29/2020 12:59 PM &p  Agree with assessment and plan by Cyndi Bender NP  Allen Walton is a 50 year old married African-American male with multiple cardiac risk factors but developed chest pain at 830 this morning while at work at [employer].  His EKG showed inferolateral ST segment elevation.  He was  treated with IV heparin and aspirin, and was transported urgently to [facility] for cardiac catheterization and potential intervention.  Runell Gess, M.D., FACP, [facility], [facility] [address] [place], [place]  [address]  [phone] 11/29/2020 1:50 PM

## 2020-11-29 NOTE — Progress Notes (Signed)
Pt. Arrived from cath lab for  Emergency heart surgery. Pt. Alert/oriented. No consent needed per Dr. Laneta Simmers. Labs drawn, sent to lab. Unable to get ua.. Pt's glasses and 1 ring given to wife.

## 2020-11-30 ENCOUNTER — Inpatient Hospital Stay (HOSPITAL_COMMUNITY): Payer: 59

## 2020-11-30 ENCOUNTER — Other Ambulatory Visit: Payer: Self-pay

## 2020-11-30 DIAGNOSIS — I2510 Atherosclerotic heart disease of native coronary artery without angina pectoris: Secondary | ICD-10-CM

## 2020-11-30 LAB — URINALYSIS, ROUTINE W REFLEX MICROSCOPIC
Bacteria, UA: NONE SEEN
Bilirubin Urine: NEGATIVE
Glucose, UA: NEGATIVE mg/dL
Ketones, ur: NEGATIVE mg/dL
Leukocytes,Ua: NEGATIVE
Nitrite: NEGATIVE
Protein, ur: NEGATIVE mg/dL
Specific Gravity, Urine: 1.038 — ABNORMAL HIGH (ref 1.005–1.030)
pH: 5 (ref 5.0–8.0)

## 2020-11-30 LAB — CBC
HCT: 35.1 % — ABNORMAL LOW (ref 39.0–52.0)
HCT: 32.5 % — ABNORMAL LOW (ref 39.0–52.0)
HCT: 31.6 % — ABNORMAL LOW (ref 39.0–52.0)
Hemoglobin: 11 g/dL — ABNORMAL LOW (ref 13.0–17.0)
Hemoglobin: 10 g/dL — ABNORMAL LOW (ref 13.0–17.0)
Hemoglobin: 9.7 g/dL — ABNORMAL LOW (ref 13.0–17.0)
MCH: 23.3 pg — ABNORMAL LOW (ref 26.0–34.0)
MCH: 22.9 pg — ABNORMAL LOW (ref 26.0–34.0)
MCH: 22.7 pg — ABNORMAL LOW (ref 26.0–34.0)
MCHC: 31.3 g/dL (ref 30.0–36.0)
MCHC: 30.8 g/dL (ref 30.0–36.0)
MCHC: 30.7 g/dL (ref 30.0–36.0)
MCV: 74.4 fL — ABNORMAL LOW (ref 80.0–100.0)
MCV: 74.5 fL — ABNORMAL LOW (ref 80.0–100.0)
MCV: 74 fL — ABNORMAL LOW (ref 80.0–100.0)
Platelets: 228 10*3/uL (ref 150–400)
Platelets: 228 10*3/uL (ref 150–400)
Platelets: 211 10*3/uL (ref 150–400)
RBC: 4.72 MIL/uL (ref 4.22–5.81)
RBC: 4.36 MIL/uL (ref 4.22–5.81)
RBC: 4.27 MIL/uL (ref 4.22–5.81)
RDW: 15.6 % — ABNORMAL HIGH (ref 11.5–15.5)
RDW: 15.4 % (ref 11.5–15.5)
RDW: 15.6 % — ABNORMAL HIGH (ref 11.5–15.5)
WBC: 12.6 10*3/uL — ABNORMAL HIGH (ref 4.0–10.5)
WBC: 12.1 10*3/uL — ABNORMAL HIGH (ref 4.0–10.5)
WBC: 9.9 10*3/uL (ref 4.0–10.5)
nRBC: 0 % (ref 0.0–0.2)
nRBC: 0 % (ref 0.0–0.2)
nRBC: 0 % (ref 0.0–0.2)

## 2020-11-30 LAB — BASIC METABOLIC PANEL
Anion gap: 5 (ref 5–15)
Anion gap: 6 (ref 5–15)
Anion gap: 3 — ABNORMAL LOW (ref 5–15)
BUN: 14 mg/dL (ref 6–20)
BUN: 14 mg/dL (ref 6–20)
BUN: 13 mg/dL (ref 6–20)
CO2: 22 mmol/L (ref 22–32)
CO2: 22 mmol/L (ref 22–32)
CO2: 23 mmol/L (ref 22–32)
Calcium: 7.8 mg/dL — ABNORMAL LOW (ref 8.9–10.3)
Calcium: 7.8 mg/dL — ABNORMAL LOW (ref 8.9–10.3)
Calcium: 7.6 mg/dL — ABNORMAL LOW (ref 8.9–10.3)
Chloride: 111 mmol/L (ref 98–111)
Chloride: 108 mmol/L (ref 98–111)
Chloride: 109 mmol/L (ref 98–111)
Creatinine, Ser: 1.04 mg/dL (ref 0.61–1.24)
Creatinine, Ser: 1 mg/dL (ref 0.61–1.24)
Creatinine, Ser: 0.98 mg/dL (ref 0.61–1.24)
GFR, Estimated: >60 mL/min (ref >60)
GFR, Estimated: >60 mL/min (ref >60)
GFR, Estimated: >60 mL/min (ref >60)
Glucose, Bld: 109 mg/dL — ABNORMAL HIGH (ref 70–99)
Glucose, Bld: 94 mg/dL (ref 70–99)
Glucose, Bld: 128 mg/dL — ABNORMAL HIGH (ref 70–99)
Potassium: 4.6 mmol/L (ref 3.5–5.1)
Potassium: 4.8 mmol/L (ref 3.5–5.1)
Potassium: 4.4 mmol/L (ref 3.5–5.1)
Sodium: 138 mmol/L (ref 135–145)
Sodium: 136 mmol/L (ref 135–145)
Sodium: 135 mmol/L (ref 135–145)

## 2020-11-30 LAB — POCT I-STAT 7, (LYTES, BLD GAS, ICA,H+H)
Acid-base deficit: 3 mmol/L — ABNORMAL HIGH (ref 0.0–2.0)
Bicarbonate: 22.8 mmol/L (ref 20.0–28.0)
Calcium, Ion: 1.18 mmol/L (ref 1.15–1.40)
HCT: 35 % — ABNORMAL LOW (ref 39.0–52.0)
Hemoglobin: 11.9 g/dL — ABNORMAL LOW (ref 13.0–17.0)
O2 Saturation: 97 %
Patient temperature: 37.4
Potassium: 4.5 mmol/L (ref 3.5–5.1)
Sodium: 141 mmol/L (ref 135–145)
TCO2: 24 mmol/L (ref 22–32)
pCO2 arterial: 45.6 mmHg (ref 32.0–48.0)
pH, Arterial: 7.309 — ABNORMAL LOW (ref 7.350–7.450)
pO2, Arterial: 97 mmHg (ref 83.0–108.0)

## 2020-11-30 LAB — GLUCOSE, CAPILLARY
Glucose-Capillary: 101 mg/dL — ABNORMAL HIGH (ref 70–99)
Glucose-Capillary: 107 mg/dL — ABNORMAL HIGH (ref 70–99)
Glucose-Capillary: 108 mg/dL — ABNORMAL HIGH (ref 70–99)
Glucose-Capillary: 109 mg/dL — ABNORMAL HIGH (ref 70–99)
Glucose-Capillary: 112 mg/dL — ABNORMAL HIGH (ref 70–99)
Glucose-Capillary: 115 mg/dL — ABNORMAL HIGH (ref 70–99)
Glucose-Capillary: 122 mg/dL — ABNORMAL HIGH (ref 70–99)
Glucose-Capillary: 123 mg/dL — ABNORMAL HIGH (ref 70–99)
Glucose-Capillary: 129 mg/dL — ABNORMAL HIGH (ref 70–99)
Glucose-Capillary: 152 mg/dL — ABNORMAL HIGH (ref 70–99)
Glucose-Capillary: 207 mg/dL — ABNORMAL HIGH (ref 70–99)

## 2020-11-30 LAB — ECHOCARDIOGRAM LIMITED
AR max vel: 4.28 cm2
AV Area VTI: 4.04 cm2
AV Area mean vel: 4.04 cm2
AV Mean grad: 2 mmHg
AV Peak grad: 3.5 mmHg
Ao pk vel: 0.93 m/s
Area-P 1/2: 5.97 cm2
Height: 73 in
S' Lateral: 3.4 cm
Weight: 3433.6 oz

## 2020-11-30 LAB — MAGNESIUM
Magnesium: 2.7 mg/dL — ABNORMAL HIGH (ref 1.7–2.4)
Magnesium: 2.5 mg/dL — ABNORMAL HIGH (ref 1.7–2.4)

## 2020-11-30 MED ORDER — ENOXAPARIN SODIUM 40 MG/0.4ML IJ SOSY
40.0000 mg | PREFILLED_SYRINGE | Freq: Every day | INTRAMUSCULAR | Status: DC
  Administered 2020-11-30: 40 mg via SUBCUTANEOUS
  Filled 2020-11-30: qty 0.4

## 2020-11-30 MED ORDER — INSULIN DETEMIR 100 UNIT/ML ~~LOC~~ SOLN
25.0000 [IU] | Freq: Every day | SUBCUTANEOUS | Status: DC
  Administered 2020-12-02 – 2020-12-04 (×3): 25 [IU] via SUBCUTANEOUS
  Filled 2020-11-30 (×4): qty 0.25

## 2020-11-30 MED ORDER — INSULIN ASPART 100 UNIT/ML IJ SOLN
0.0000 [IU] | INTRAMUSCULAR | Status: DC
  Administered 2020-11-30: 2 [IU] via SUBCUTANEOUS
  Administered 2020-11-30: 8 [IU] via SUBCUTANEOUS
  Administered 2020-12-01: 2 [IU] via SUBCUTANEOUS

## 2020-11-30 MED ORDER — PERFLUTREN LIPID MICROSPHERE
1.0000 mL | INTRAVENOUS | Status: AC | PRN
  Administered 2020-11-30: 4 mL via INTRAVENOUS
  Filled 2020-11-30: qty 10

## 2020-11-30 MED ORDER — FUROSEMIDE 10 MG/ML IJ SOLN
40.0000 mg | Freq: Once | INTRAMUSCULAR | Status: AC
  Administered 2020-11-30: 40 mg via INTRAVENOUS
  Filled 2020-11-30: qty 4

## 2020-11-30 MED ORDER — ORAL CARE MOUTH RINSE
15.0000 mL | Freq: Two times a day (BID) | OROMUCOSAL | Status: DC
  Administered 2020-11-30: 15 mL via OROMUCOSAL

## 2020-11-30 MED ORDER — INSULIN DETEMIR 100 UNIT/ML ~~LOC~~ SOLN
25.0000 [IU] | Freq: Every day | SUBCUTANEOUS | Status: DC
  Administered 2020-11-30 – 2020-12-01 (×2): 25 [IU] via SUBCUTANEOUS
  Filled 2020-11-30 (×2): qty 0.25

## 2020-11-30 MED ORDER — KETOROLAC TROMETHAMINE 15 MG/ML IJ SOLN
15.0000 mg | Freq: Four times a day (QID) | INTRAMUSCULAR | Status: DC | PRN
  Administered 2020-11-30 – 2020-12-01 (×3): 15 mg via INTRAVENOUS
  Filled 2020-11-30 (×3): qty 1

## 2020-11-30 NOTE — Progress Notes (Addendum)
Right Radial Arterial SHEATH removed by Cath Lab, Eugenia Pancoast, RCIS  TRBand Applied with 10cc of Air by [employer], [facility]   Advised to leave ion place for and then begin to deflate 2cc until removal.

## 2020-11-30 NOTE — Progress Notes (Signed)
Right  Radial Arterial Sheath Removal Note  Attempted to pullback and  flush the sheath with Normal Saline which was unsuccessful Site was clean dry and intact, no oozing 6 Fr arterial sheath removed intact with noted minimal clot, TR Band placed successfully 10 cc at 0208 am.  Andruw RN confirmed placement and Level 0, advised since it was already clotting to wait 30 minutes to start deflating 2 cc per 15 min until completely removed then continue to follow normal TR Band protocol.

## 2020-11-30 NOTE — Progress Notes (Addendum)
1 Day Post-Op Procedure(s) (LRB): CORONARY ARTERY BYPASS GRAFTING (CABG) TIMES ONE, ON PUMP, USING LEFT INTERNAL MAMMARY ARTERY (N/A) TRANSESOPHAGEAL ECHOCARDIOGRAM (TEE) (N/A) Subjective: Only complaint is of chest wall pain.  Objective: Vital signs in last 24 hours: Temp:  [97.3 F (36.3 C)-99.7 F (37.6 C)] 99.32 F (37.4 C) (05/22 0700) Pulse Rate:  [81-164] 84 (05/22 0700) Resp:  [0-28] 26 (05/22 0700) BP: (79-187)/(53-154) 103/67 (05/22 0700) SpO2:  [70 %-100 %] 98 % (05/22 0700) Arterial Line BP: (88-172)/(35-145) 110/47 (05/22 0700) FiO2 (%):  [40 %-50 %] 40 % (05/21 2310) Weight:  [97.3 kg-99.8 kg] 97.3 kg (05/22 0500)  Hemodynamic parameters for last 24 hours: PAP: (23-57)/(7-28) 29/13 CO:  [4.6 L/min-6.1 L/min] 5.7 L/min CI:  [2.1 L/min/m2-2.7 L/min/m2] 2.6 L/min/m2  Intake/Output from previous day: 05/21 0701 - 05/22 0700 In: 3938.5 [I.V.:2245.7; Blood:493; IV Piggyback:1199.9] Out: 2884 [Urine:1700; Blood:799; Chest Tube:385] Intake/Output this shift: No intake/output data recorded.  General appearance: alert and cooperative Neurologic: intact Heart: regular rate and rhythm, S1, S2 normal, no murmur Lungs: clear to auscultation bilaterally Extremities: edema mild Wound: dressing dry  Lab Results: Recent Labs    11/30/20 0043 11/30/20 0407  WBC 12.6* 12.1*  HGB 11.0*  11.9* 10.0*  HCT 35.1*  35.0* 32.5*  PLT 228 228   BMET:  Recent Labs    11/30/20 0043 11/30/20 0407  NA 138  141 136  K 4.6  4.5 4.8  CL 111 108  CO2 22 22  GLUCOSE 109* 94  BUN 14 14  CREATININE 1.04 1.00  CALCIUM 7.8* 7.8*    PT/INR:  Recent Labs    11/29/20 1858  LABPROT 15.0  INR 1.2   ABG    Component Value Date/Time   PHART 7.309 (L) 11/30/2020 0043   HCO3 22.8 11/30/2020 0043   TCO2 24 11/30/2020 0043   ACIDBASEDEF 3.0 (H) 11/30/2020 0043   O2SAT 97.0 11/30/2020 0043   CBG (last 3)  Recent Labs    11/30/20 0457 11/30/20 0558 11/30/20 0642   GLUCAP 112* 122* 108*   CXR: clear  ECG: sinus, diffuse mild ST elevation consistent with pericarditis.   Assessment/Plan: S/P Procedure(s) (LRB): CORONARY ARTERY BYPASS GRAFTING (CABG) TIMES ONE, ON PUMP, USING LEFT INTERNAL MAMMARY ARTERY (N/A) TRANSESOPHAGEAL ECHOCARDIOGRAM (TEE) (N/A)  POD 1  Hemodynamically stable in sinus rhythm. Still on neo so will hold Lopressor until stable off neo.  DM: glucose under control on drip. Will transition to Levemir and SSI.  Wt is below recorded preop wt.  DC chest tubes, swan. DC arterial line when off neo.  IS, OOB, mobilize  Add Toradol for pain.  Plan to add Plavix to ASA after pacing wires out with STEMI.   LOS: 1 day    Alleen Borne 11/30/2020

## 2020-11-30 NOTE — Procedures (Signed)
Arterial Catheter Insertion Procedure Note  Allen Walton  [id number]  12-Jun-1971  Date:11/30/20  Time:2:11 AM    Provider Performing: Kenyon Ana C    Procedure: Insertion of Arterial Line (57262) without US guidance  Indication(s) Blood pressure monitoring and/or need for frequent ABGs  Consent Risks of the procedure as well as the alternatives and risks of each were explained to the patient and/or caregiver.  Consent for the procedure was obtained and is signed in the bedside chart  Anesthesia None   Time Out Verified patient identification, verified procedure, site/side was marked, verified correct patient position, special equipment/implants available, medications/allergies/relevant history reviewed, required imaging and test results available.   Sterile Technique Maximal sterile technique including full sterile barrier drape, hand hygiene, sterile gown, sterile gloves, mask, hair covering, sterile ultrasound probe cover (if used).   Procedure Description Area of catheter insertion was cleaned with chlorhexidine and draped in sterile fashion. Without real-time ultrasound guidance an arterial catheter was placed into the left radial artery.  Appropriate arterial tracings confirmed on monitor.     Complications/Tolerance None; patient tolerated the procedure well.   EBL Minimal   Specimen(s) None

## 2020-11-30 NOTE — Progress Notes (Signed)
RT at bedside to place Aline since pt current Aline is a Arterial Sheath that is being transduced to get an arterial reading. RN will contact Cath Lab to come pull Right Radial Sheath once RT places an Aline.

## 2020-11-30 NOTE — Progress Notes (Signed)
Patient ID: Allen Walton, male   DOB: 1971/06/02, 50 y.o.   MRN: [id number]  TCTS Evening Rounds:   Hemodynamically stable   Urine output ok     CBC    Component Value Date/Time   WBC 9.9 11/30/2020 1445   RBC 4.27 11/30/2020 1445   HGB 9.7 (L) 11/30/2020 1445   HCT 31.6 (L) 11/30/2020 1445   PLT 211 11/30/2020 1445   MCV 74.0 (L) 11/30/2020 1445   MCH 22.7 (L) 11/30/2020 1445   MCHC 30.7 11/30/2020 1445   RDW 15.6 (H) 11/30/2020 1445     BMET    Component Value Date/Time   NA 135 11/30/2020 1445   K 4.4 11/30/2020 1445   CL 109 11/30/2020 1445   CO2 23 11/30/2020 1445   GLUCOSE 128 (H) 11/30/2020 1445   BUN 13 11/30/2020 1445   CREATININE 0.98 11/30/2020 1445   CALCIUM 7.6 (L) 11/30/2020 1445   GFRNONAA >60 11/30/2020 1445     A/P:  Stable postop course. Continue current plans. Start diuresis.

## 2020-11-30 NOTE — Progress Notes (Incomplete)
  Echocardiogram 2D Echocardiogram has been performed.  Allen Walton  Allen Walton 11/30/2020, 9:30 AM

## 2020-11-30 NOTE — Plan of Care (Signed)

## 2020-12-01 ENCOUNTER — Encounter (HOSPITAL_COMMUNITY): Payer: Self-pay | Admitting: Cardiovascular Disease

## 2020-12-01 ENCOUNTER — Inpatient Hospital Stay (HOSPITAL_COMMUNITY): Payer: 59

## 2020-12-01 DIAGNOSIS — Z951 Presence of aortocoronary bypass graft: Secondary | ICD-10-CM

## 2020-12-01 LAB — BASIC METABOLIC PANEL
Anion gap: 6 (ref 5–15)
BUN: 13 mg/dL (ref 6–20)
CO2: 24 mmol/L (ref 22–32)
Calcium: 7.8 mg/dL — ABNORMAL LOW (ref 8.9–10.3)
Chloride: 104 mmol/L (ref 98–111)
Creatinine, Ser: 1.24 mg/dL (ref 0.61–1.24)
GFR, Estimated: >60 mL/min (ref >60)
Glucose, Bld: 126 mg/dL — ABNORMAL HIGH (ref 70–99)
Potassium: 4.1 mmol/L (ref 3.5–5.1)
Sodium: 134 mmol/L — ABNORMAL LOW (ref 135–145)

## 2020-12-01 LAB — CBC
HCT: 32.9 % — ABNORMAL LOW (ref 39.0–52.0)
Hemoglobin: 10.2 g/dL — ABNORMAL LOW (ref 13.0–17.0)
MCH: 22.7 pg — ABNORMAL LOW (ref 26.0–34.0)
MCHC: 31 g/dL (ref 30.0–36.0)
MCV: 73.3 fL — ABNORMAL LOW (ref 80.0–100.0)
Platelets: 205 10*3/uL (ref 150–400)
RBC: 4.49 MIL/uL (ref 4.22–5.81)
RDW: 15.6 % — ABNORMAL HIGH (ref 11.5–15.5)
WBC: 9.5 10*3/uL (ref 4.0–10.5)
nRBC: 0 % (ref 0.0–0.2)

## 2020-12-01 LAB — GLUCOSE, CAPILLARY
Glucose-Capillary: 110 mg/dL — ABNORMAL HIGH (ref 70–99)
Glucose-Capillary: 110 mg/dL — ABNORMAL HIGH (ref 70–99)
Glucose-Capillary: 115 mg/dL — ABNORMAL HIGH (ref 70–99)
Glucose-Capillary: 120 mg/dL — ABNORMAL HIGH (ref 70–99)
Glucose-Capillary: 128 mg/dL — ABNORMAL HIGH (ref 70–99)
Glucose-Capillary: 137 mg/dL — ABNORMAL HIGH (ref 70–99)
Glucose-Capillary: 92 mg/dL (ref 70–99)

## 2020-12-01 MED ORDER — PANTOPRAZOLE SODIUM 40 MG PO TBEC
40.0000 mg | DELAYED_RELEASE_TABLET | Freq: Every day | ORAL | Status: DC
  Administered 2020-12-02 – 2020-12-04 (×3): 40 mg via ORAL
  Filled 2020-12-01 (×3): qty 1

## 2020-12-01 MED ORDER — SODIUM CHLORIDE 0.9 % IV SOLN: 250.0000 mL | INTRAVENOUS | Status: DC | PRN

## 2020-12-01 MED ORDER — METOPROLOL TARTRATE 25 MG PO TABS
25.0000 mg | ORAL_TABLET | Freq: Two times a day (BID) | ORAL | Status: DC
  Administered 2020-12-01 – 2020-12-04 (×7): 25 mg via ORAL
  Filled 2020-12-01 (×7): qty 1

## 2020-12-01 MED ORDER — ATORVASTATIN CALCIUM 80 MG PO TABS
80.0000 mg | ORAL_TABLET | Freq: Every day | ORAL | Status: DC
  Administered 2020-12-01 – 2020-12-04 (×4): 80 mg via ORAL
  Filled 2020-12-01 (×4): qty 1

## 2020-12-01 MED ORDER — SENNOSIDES-DOCUSATE SODIUM 8.6-50 MG PO TABS
1.0000 | ORAL_TABLET | Freq: Two times a day (BID) | ORAL | Status: DC | PRN
  Administered 2020-12-02 – 2020-12-03 (×2): 1 via ORAL
  Filled 2020-12-01 (×2): qty 1

## 2020-12-01 MED ORDER — ASPIRIN EC 325 MG PO TBEC
325.0000 mg | DELAYED_RELEASE_TABLET | Freq: Every day | ORAL | Status: DC
  Administered 2020-12-02: 325 mg via ORAL
  Filled 2020-12-01: qty 1

## 2020-12-01 MED ORDER — CHLORHEXIDINE GLUCONATE CLOTH 2 % EX PADS
6.0000 | MEDICATED_PAD | Freq: Every day | CUTANEOUS | Status: DC
  Administered 2020-12-01: 6 via TOPICAL

## 2020-12-01 MED ORDER — POTASSIUM CHLORIDE CRYS ER 20 MEQ PO TBCR
20.0000 meq | EXTENDED_RELEASE_TABLET | Freq: Every day | ORAL | Status: DC
  Administered 2020-12-01: 20 meq via ORAL
  Filled 2020-12-01: qty 1

## 2020-12-01 MED ORDER — ONDANSETRON HCL 4 MG/2ML IJ SOLN: 4.0000 mg | Freq: Four times a day (QID) | INTRAMUSCULAR | Status: DC | PRN

## 2020-12-01 MED ORDER — SODIUM CHLORIDE 0.9% FLUSH: 3.0000 mL | INTRAVENOUS | Status: DC | PRN

## 2020-12-01 MED ORDER — TRAMADOL HCL 50 MG PO TABS
50.0000 mg | ORAL_TABLET | ORAL | Status: DC | PRN
  Administered 2020-12-01 (×2): 100 mg via ORAL
  Filled 2020-12-01 (×2): qty 2

## 2020-12-01 MED ORDER — OXYCODONE HCL 5 MG PO TABS
5.0000 mg | ORAL_TABLET | ORAL | Status: DC | PRN
  Administered 2020-12-01 – 2020-12-04 (×8): 10 mg via ORAL
  Filled 2020-12-01 (×8): qty 2

## 2020-12-01 MED ORDER — ONDANSETRON HCL 4 MG PO TABS: 4.0000 mg | ORAL_TABLET | Freq: Four times a day (QID) | ORAL | Status: DC | PRN

## 2020-12-01 MED ORDER — ~~LOC~~ CARDIAC SURGERY, PATIENT & FAMILY EDUCATION: Freq: Once | Status: AC

## 2020-12-01 MED ORDER — INSULIN ASPART 100 UNIT/ML IJ SOLN
0.0000 [IU] | Freq: Three times a day (TID) | INTRAMUSCULAR | Status: DC
  Administered 2020-12-01: 2 [IU] via SUBCUTANEOUS

## 2020-12-01 MED ORDER — SODIUM CHLORIDE 0.9% FLUSH
3.0000 mL | Freq: Two times a day (BID) | INTRAVENOUS | Status: DC
  Administered 2020-12-01 – 2020-12-03 (×5): 3 mL via INTRAVENOUS

## 2020-12-01 MED ORDER — INSULIN ASPART 100 UNIT/ML IJ SOLN
0.0000 [IU] | Freq: Three times a day (TID) | INTRAMUSCULAR | Status: DC
  Administered 2020-12-02: 4 [IU] via SUBCUTANEOUS
  Administered 2020-12-02 (×2): 2 [IU] via SUBCUTANEOUS
  Administered 2020-12-02: 3 [IU] via SUBCUTANEOUS
  Administered 2020-12-03 – 2020-12-04 (×4): 2 [IU] via SUBCUTANEOUS

## 2020-12-01 MED ORDER — ACETAMINOPHEN 325 MG PO TABS: 650.0000 mg | ORAL_TABLET | Freq: Four times a day (QID) | ORAL | Status: DC | PRN

## 2020-12-01 MED FILL — Thrombin (Recombinant) For Soln 20000 Unit: CUTANEOUS | Qty: 1 | Status: AC

## 2020-12-01 MED FILL — Potassium Chloride Inj 2 mEq/ML: INTRAVENOUS | Qty: 40 | Status: AC

## 2020-12-01 MED FILL — Heparin Sodium (Porcine) Inj 1000 Unit/ML: INTRAMUSCULAR | Qty: 30 | Status: AC

## 2020-12-01 MED FILL — Magnesium Sulfate Inj 50%: INTRAMUSCULAR | Qty: 10 | Status: AC

## 2020-12-01 NOTE — Progress Notes (Signed)
Mobility Specialist: Progress Note   12/01/20 1643  Mobility  Activity Ambulated in hall  Level of Assistance Independent  Assistive Device Front wheel walker  Distance Ambulated (ft) 370 ft  Mobility Ambulated independently in hallway  Mobility Response Tolerated well  Mobility performed by Mobility specialist  $Mobility charge 1 Mobility   Pre-Mobility: 95 HR, 95% SpO2 During Mobility: 111 HR Post-Mobility: 100 HR, 142/83 BP, 100% SpO2  Pt c/o 8/10 chest pain during ambulation, otherwise asx. Pt to bed after walk per request, call bell at his side.   [facility] Novalie Leamy Mobility Specialist Mobility Specialist Phone: [phone]

## 2020-12-01 NOTE — Hospital Course (Signed)
History of Present Illness   The patient is a 50 year old gentleman with history of diabetes and 1/2 pack/day smoking who developed onset of substernal chest pain around 8 AM this morning after breakfast.  The pain felt like heartburn and was radiating to his left arm with associated shortness of breath.  He was seen at [facility] emergency department and found to have an elevated troponin and electrocardiogram showing ST elevation in the inferior leads and V4 to V6 consistent with STEMI.  He was transferred to [facility] and taken the Cath Lab by Dr. Allyson Sabal showing a ruptured plaque in the ostium of the LAD just beyond the left main with TIMI II flow.  The degree of stenosis was estimated at 95%.  Left ventricular function was preserved.  The patient had minimal residual pain in the Cath Lab.  Dr. Allyson Sabal and I felt the best option was to take the patient the operating room emergently for a left internal mammary graft to the LAD. This was offered to Mr. Honaker and he elected to proceed.   Course in Hospital  Mr. Ahlgren was taken to the operating room urgently and single-vessel coronary bypass was accomplished with the left internal mammary artery grafted to the left anterior descending coronary artery. Following the procedure, he separated from cardiopulmonary bypass without difficulty and without requiring inotropic support.  He was transferred to the ICU in stable condition.  He was weaned from mechanical ventilatory support and extubated within a few hour of surgery.  He was mobilized and progressed will with ambulation. Tubes and monitoring lines were removed routinely. He was transferred to 4E progressive care on post-op day 2.

## 2020-12-01 NOTE — Progress Notes (Signed)
CARDIAC REHAB PHASE I   PRE:  Rate/Rhythm: 91 SR  BP:  Supine: 120/72  Sitting:   Standing:    SaO2: 95%RA  MODE:  Ambulation: 370 ft   POST:  Rate/Rhythm: 103 ST  BP:  Supine: 139/81  Sitting:   Standing:    SaO2: 95%RA 1329-1413 Pt walked 370 ft on RA with rolling walker and minimal asst. C/o incisional pain. Notified staff for pain med. Back to bed after walk. Pt needs reinforcement of sternal precautions. Discussed staying in the tube. Gave wife handout. Encouraged IS. Second walk today. Tolerated well.   Luetta Nutting, RN BSN  12/01/2020 2:09 PM

## 2020-12-01 NOTE — Progress Notes (Signed)
Patient to room 4E21 from 2H. Vital signs obtained. On monitor CCMD notified. CHG bath completed. Alert and oriented to room and call light. Call bell within reach.  Menachem Urbanek R Jordanny Waddington, RN 

## 2020-12-01 NOTE — Progress Notes (Signed)
Progress Note  Patient Name: Allen Walton Date of Encounter: 12/01/2020  [facility] Cardiologist: Dr. Nanetta Batty  Subjective   Postop day 2 CABG x1 with LIMA to LAD in the setting of ruptured ostial/proximal LAD plaque and inferior ST segment elevation.  Patient is progressing nicely.  Denies chest pain or shortness of breath.  Inpatient Medications    Scheduled Meds: . acetaminophen  1,000 mg Oral Q6H   Or  . acetaminophen (TYLENOL) oral liquid 160 mg/5 mL  1,000 mg Per Tube Q6H  . aspirin EC  325 mg Oral Daily   Or  . aspirin  324 mg Per Tube Daily  . bisacodyl  10 mg Oral Daily   Or  . bisacodyl  10 mg Rectal Daily  . Chlorhexidine Gluconate Cloth  6 each Topical Daily  . docusate sodium  200 mg Oral Daily  . enoxaparin (LOVENOX) injection  40 mg Subcutaneous QHS  . insulin aspart  0-24 Units Subcutaneous TID WC  . insulin detemir  25 Units Subcutaneous Daily  . insulin detemir  25 Units Subcutaneous Daily  . mouth rinse  15 mL Mouth Rinse BID  . metoprolol tartrate  25 mg Oral BID  . pantoprazole  40 mg Oral Daily  . sodium chloride flush  10-40 mL Intracatheter Q12H  . sodium chloride flush  3 mL Intravenous Q12H   Continuous Infusions: . sodium chloride Stopped (11/30/20 1526)  . sodium chloride    . sodium chloride 10 mL/hr at 11/29/20 1923  .  ceFAZolin (ANCEF) IV Stopped (12/01/20 0533)  . lactated ringers    . lactated ringers Stopped (12/01/20 0808)   PRN Meds: sodium chloride, dextrose, metoprolol tartrate, morphine injection, ondansetron (ZOFRAN) IV, oxyCODONE, sodium chloride flush, sodium chloride flush, traMADol   Vital Signs    Vitals:   12/01/20 0615 12/01/20 0630 12/01/20 0700 12/01/20 0800  BP:   (!) 144/81 117/62  Pulse: 77 83 (!) 102 91  Resp: (!) 28 (!) 0 (!) 23 (!) 24  Temp:  98.3 F (36.8 C)    TempSrc:  Oral    SpO2: 96% 97% 99% 97%  Weight:      Height:        Intake/Output Summary (Last 24 hours) at 12/01/2020  0919 Last data filed at 12/01/2020 0827 Gross per 24 hour  Intake 1530.71 ml  Output 1850 ml  Net -319.29 ml   Last 3 Weights 12/01/2020 11/30/2020 11/29/2020  Weight (lbs) 225 lb 9.6 oz 214 lb 9.6 oz 220 lb  Weight (kg) 102.331 kg 97.342 kg 99.791 kg      Telemetry    Sinus rhythm- Personally Reviewed  ECG    Not performed today- Personally Reviewed  Physical Exam   GEN: No acute distress.   Neck: No JVD Cardiac: RRR, no murmurs, rubs, or gallops.  Respiratory: Clear to auscultation bilaterally. GI: Soft, nontender, non-distended  MS: No edema; No deformity. Neuro:  Nonfocal  Psych: Normal affect   Labs    High Sensitivity Troponin:  No results for input(s): TROPONINIHS in the last 720 hours.    Chemistry Recent Labs  Lab 11/29/20 1415 11/29/20 1528 11/30/20 0407 11/30/20 1445 12/01/20 0346  NA 138   < > 136 135 134*  K 4.5   < > 4.8 4.4 4.1  CL 108   < > 108 109 104  CO2 23   < > 22 23 24   GLUCOSE 94   < > 94 128* 126*  BUN  16   < > CREATININE 0.91   < > 1.00 0.98 1.24  CALCIUM 8.4*   < > 7.8* 7.6* 7.8*  PROT 6.1*  --   --   --   --   ALBUMIN 3.3*  --   --   --   --   AST 16  --   --   --   --   ALT 16  --   --   --   --   ALKPHOS 41  --   --   --   --   BILITOT 0.6  --   --   --   --   GFRNONAA >60   < > >60 >60 >60  ANIONGAP 7   < > 6 3* 6   < > = values in this interval not displayed.     Hematology Recent Labs  Lab 11/30/20 0407 11/30/20 1445 12/01/20 0346  WBC 12.1* 9.9 9.5  RBC 4.36 4.27 4.49  HGB 10.0* 9.7* 10.2*  HCT 32.5* 31.6* 32.9*  MCV 74.5* 74.0* 73.3*  MCH 22.9* 22.7* 22.7*  MCHC 30.8 30.7 31.0  RDW 15.4 15.6* 15.6*  PLT 228 211 205    BNPNo results for input(s): BNP, PROBNP in the last 168 hours.   DDimer No results for input(s): DDIMER in the last 168 hours.   Radiology    CARDIAC CATHETERIZATION  Result Date: 11/29/2020  Ost LAD to Prox LAD lesion is 95% stenosed.  Allen Walton is a 50 y.o. male   [id number] LOCATION:  FACILITY: [facility] PHYSICIAN: Nanetta Batty, M.D. 16-Jun-1971 DATE OF PROCEDURE:  11/29/2020 DATE OF DISCHARGE: CARDIAC CATHETERIZATION History obtained from chart review.  Allen Walton is a 50 year old mildly overweight married African-American male without prior cardiac history.  He does have a history of tobacco abuse, hypertension, hyperlipidemia and diabetes.  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  In the ER he was found to have inferolateral ST segment elevation.  He was treated with IV heparin and aspirin and was transported urgently to [facility] for cardiac catheterization and potential intervention. PROCEDURE DESCRIPTION: The patient was brought to the second floor [facility]iac cath lab in the postabsorptive state. He was not premedicated . His right wrist was prepped and shaved in usual sterile fashion. Xylocaine 1% was used for local anesthesia. A 6 French sheath was inserted into the right radial artery using standard Seldinger technique. The patient received 10,000 units  of heparin intravenously.  A 5 [place] TIG catheter and pigtail catheters were used for selective coronary angiography and left ventriculography respectively.  Isovue dye is used for the entirety of the case (75 cc of contrast administered patient).  Retrograde aortic, ventricular and pullback pressures were recorded.  Radial cocktail was administered via the SideArm sheath.  ACT was measured to 97.   Mr. Allen Walton has a ruptured plaque in the ostial LAD just off the left main and a huge vessel.  He has TIMI II flow.  The remainder of his coronary nanometer is free of significant disease.  He has preserved LV function with mild apical hypokinesia.  I believe the best revascularization option would be CABG with a LIMA to his LAD.  I have asked Dr. Laneta Simmers to review and he agrees.  We will keep the patient on a heparin drip.  He did  receive aspirin.  He is hemodynamically  stable with a blood pressure in the 120/80 range in sinus rhythm.  He is currently almost pain-free.  We talked about risk factor modification including smoking cessation. Nanetta Batty. MD, [facility] 11/29/2020 1:52 PM   DG Chest Port 1 View  Result Date: 12/01/2020 CLINICAL DATA:  History of CABG. EXAM: PORTABLE CHEST 1 VIEW COMPARISON:  11/30/2020 FINDINGS: Pulmonary arterial catheter has been removed. Right jugular central line is still present. Chest drains have been removed. Negative for pneumothorax. Hazy and patchy densities at the left lung base are suggestive for atelectasis and possibly pleural fluid. Heart size is stable with median sternotomy wires. IMPRESSION: 1. Hazy and patchy densities at the left lung base. Findings are suggestive for atelectasis and possibly a small amount of pleural fluid. 2. Removal of chest drains without a pneumothorax. Electronically Signed   By: Richarda Overlie M.D.   On: 12/01/2020 08:00   DG Chest Port 1 View  Result Date: 11/30/2020 CLINICAL DATA:  Status post CABG. EXAM: PORTABLE CHEST 1 VIEW COMPARISON:  11/29/2020 FINDINGS: CABG changes, RIGHT IJ Swan-Ganz catheter with tip overlying the pulmonary outflow tract/main pulmonary artery, and mediastinal/LEFT thoracostomy tube is are again noted. An endotracheal tube is been removed. Mild bibasilar atelectasis again identified. There is no evidence of pneumothorax, large pleural effusion or edema. IMPRESSION: Endotracheal tube removal without other significant change. No pneumothorax. Electronically Signed   By: Harmon Pier M.D.   On: 11/30/2020 08:14   DG Chest Port 1 View  Result Date: 11/29/2020 CLINICAL DATA:  Status post coronary bypass graft. EXAM: PORTABLE CHEST 1 VIEW COMPARISON:  Same day. FINDINGS: The heart size and mediastinal contours are within normal limits. Endotracheal tube is in good position. Right internal jugular Swan-Ganz catheter is noted with tip in expected  position of main pulmonary artery. Left-sided chest tube is noted without pneumothorax. Right lung is clear. Mild left basilar atelectasis is noted. The visualized skeletal structures are unremarkable. IMPRESSION: Endotracheal tube in good position. Left-sided chest tube is noted without pneumothorax. Electronically Signed   By: Lupita Raider M.D.   On: 11/29/2020 19:19   ECHOCARDIOGRAM LIMITED  Result Date: 11/30/2020    ECHOCARDIOGRAM LIMITED REPORT   Patient Name:   GIANNY SABINO Date of Exam: 11/30/2020 Medical Rec #:  [id number]    Height:       73.0 in Accession #:    [id number]   Weight:       214.6 lb Date of Birth:  1970/12/02   BSA:          2.217 m Patient Age:    49 years     BP:           103/64 mmHg Patient Gender: M            HR:           83 bpm. Exam Location:  Inpatient Procedure: 2D Echo, Cardiac Doppler and Color Doppler Indications:    CAD  History:        Patient has prior history of Echocardiogram examinations, most                 recent 11/29/2020. Previous Myocardial Infarction and CAD; Risk                 Factors:Diabetes.  Sonographer:    Shirlean Kelly Referring Phys: [phone] Devorah Givhan J Kiowa Peifer  Sonographer Comments: Suboptimal apical window, suboptimal subcostal window and Technically difficult study due to poor echo windows. Exam done  in supine position with bandages covering windows. IMPRESSIONS  1. Limited echo for LV function, preop assessment  2. Left ventricular ejection fraction, by estimation, is 55 to 60%. The left ventricle has normal function. The left ventricle has no regional wall motion abnormalities. There is mild left ventricular hypertrophy. Left ventricular diastolic parameters were normal. There is mild hypokinesis of the left ventricular, apical apical segment.  3. The aortic valve is tricuspid. Aortic valve regurgitation is not visualized. No aortic stenosis is present. FINDINGS  Left Ventricle: Left ventricular ejection fraction, by estimation, is 55 to 60%. The  left ventricle has normal function. The left ventricle has no regional wall motion abnormalities. Mild hypokinesis of the left ventricular, apical apical segment. The left ventricular internal cavity size was normal in size. There is mild left ventricular hypertrophy. Left ventricular diastolic parameters were normal.  LV Wall Scoring: The apex is hypokinetic. Left Atrium: Left atrial size was normal in size. Right Atrium: Right atrial size was normal in size. Aortic Valve: The aortic valve is tricuspid. Aortic valve regurgitation is not visualized. No aortic stenosis is present. Aortic valve mean gradient measures 2.0 mmHg. Aortic valve peak gradient measures 3.5 mmHg. Aortic valve area, by VTI measures 4.04 cm. Pulmonic Valve: The pulmonic valve was normal in structure. Pulmonic valve regurgitation is not visualized. IAS/Shunts: No atrial level shunt detected by color flow Doppler. LEFT VENTRICLE PLAX 2D LVIDd:         4.70 cm  Diastology LVIDs:         3.40 cm  LV e' medial:    7.94 cm/s LV PW:         1.00 cm  LV E/e' medial:  9.5 LV IVS:        1.20 cm  LV e' lateral:   12.10 cm/s LVOT diam:     2.50 cm  LV E/e' lateral: 6.2 LV SV:         64 LV SV Index:   29 LVOT Area:     4.91 cm  LEFT ATRIUM           Index LA diam:      3.70 cm 1.67 cm/m LA Vol (A4C): 47.2 ml 21.29 ml/m  AORTIC VALVE AV Area (Vmax):    4.28 cm AV Area (Vmean):   4.04 cm AV Area (VTI):     4.04 cm AV Vmax:           93.40 cm/s AV Vmean:          63.000 cm/s AV VTI:            0.159 m AV Peak Grad:      3.5 mmHg AV Mean Grad:      2.0 mmHg LVOT Vmax:         81.40 cm/s LVOT Vmean:        51.800 cm/s LVOT VTI:          0.131 m LVOT/AV VTI ratio: 0.82  AORTA Ao Root diam: 3.30 cm Ao Asc diam:  2.70 cm MITRAL VALVE MV Area (PHT): 5.97 cm    SHUNTS MV Decel Time: 127 msec    Systemic VTI:  0.13 m MV E velocity: 75.50 cm/s  Systemic Diam: 2.50 cm MV A velocity: 61.80 cm/s MV E/A ratio:  1.22 Zoila ShutterKenneth Hilty MD Electronically signed by  Zoila ShutterKenneth Hilty MD Signature Date/Time: 11/30/2020/12:41:15 PM    Final     Cardiac Studies   2D echocardiogram (11/30/2020)  1. Limited echo  for LV function, preop assessment  2. Left ventricular ejection fraction, by estimation, is 55 to 60%. The  left ventricle has normal function. The left ventricle has no regional  wall motion abnormalities. There is mild left ventricular hypertrophy.  Left ventricular diastolic parameters  were normal. There is mild hypokinesis of the left ventricular, apical  apical segment.  3. The aortic valve is tricuspid. Aortic valve regurgitation is not  visualized. No aortic stenosis is present.   Cardiac catheterization (11/28/2020)  IMPRESSION: Mr. Parish has a ruptured plaque in the ostial LAD just off the left main and a huge vessel.  He has TIMI II flow.  The remainder of his coronary nanometer is free of significant disease.  He has preserved LV function with mild apical hypokinesia.  I believe the best revascularization option would be CABG with a LIMA to his LAD.  I have asked Dr. Laneta Simmers to review and he agrees.  We will keep the patient on a heparin drip.  He did receive aspirin.  He is hemodynamically stable with a blood pressure in the 120/80 range in sinus rhythm.  He is currently almost pain-free.  We talked about risk factor modification including smoking cessation.  Diagrams   Diagnostic Dominance: Right    Intervention    Implants       Patient Profile     50 y.o. married African-American male history of tobacco abuse, treated hypertension, diabetes and hyperlipidemia.  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  He was transferred by EMS starting morning with chest pain and inferolateral ST segment elevation.  I brought emergently to the Cath Lab revealing ruptured plaque in the ostial/proximal portion of the LAD best treated with surgery.  Dr. Laneta Simmers was consulted and he was taken to the OR for CABG x1 with  insulin pump LIMA to the LAD.  He is progressing nicely.  Assessment & Plan    1: Inferolateral STEMI- angiogram showed ruptured plaque ostium/proximal LAD.  Postop day 2 CABG x1 on pump LIMA to LAD.  He had preserved LV function.  On baby aspirin and beta-blocker.  2: Hyperlipidemia- on high-dose statin therapy  3: Diabetes- on sliding scale insulin.  Home diabetes medications to be determined by PCP as  Patient will be transferred to telemetry.  Anticipate early discharge.  I will see him back in the office in several weeks for follow-up.  Cardiac receptor modification including smoking cessation were emphasized today.      For questions or updates, please contact [facility] Please consult [email or web address] for contact info under        Signed, Nanetta Batty, MD  12/01/2020, 9:19 AM

## 2020-12-01 NOTE — Progress Notes (Signed)
2 Days Post-Op Procedure(s) (LRB): CORONARY ARTERY BYPASS GRAFTING (CABG) TIMES ONE, ON PUMP, USING LEFT INTERNAL MAMMARY ARTERY (N/A) TRANSESOPHAGEAL ECHOCARDIOGRAM (TEE) (N/A) Subjective: No complaints  Objective: Vital signs in last 24 hours: Temp:  [98.3 F (36.8 C)-99.5 F (37.5 C)] 98.3 F (36.8 C) (05/23 0630) Pulse Rate:  [70-105] 83 (05/23 0630) Resp:  [0-34] 0 (05/23 0630) BP: (106-158)/(58-101) 128/73 (05/23 0600) SpO2:  [91 %-100 %] 97 % (05/23 0630) Arterial Line BP: (110-138)/(53-60) 114/55 (05/22 1400) Weight:  [102.3 kg] 102.3 kg (05/23 0456)  Hemodynamic parameters for last 24 hours: PAP: (28-32)/(16-17) 28/17  Intake/Output from previous day: 05/22 0701 - 05/23 0700 In: 1450.6 [P.O.:360; I.V.:740.7; IV Piggyback:349.9] Out: 1800 [Urine:1750; Chest Tube:50] Intake/Output this shift: No intake/output data recorded.  General appearance: alert and cooperative Neurologic: intact Heart: regular rate and rhythm, S1, S2 normal, no murmur Lungs: clear to auscultation bilaterally Extremities: edema mild Wound: dressing dry  Lab Results: Recent Labs    11/30/20 1445 12/01/20 0346  WBC 9.9 9.5  HGB 9.7* 10.2*  HCT 31.6* 32.9*  PLT 211 205   BMET:  Recent Labs    11/30/20 1445 12/01/20 0346  NA 135 134*  K 4.4 4.1  CL 109 104  CO2 23 24  GLUCOSE 128* 126*  BUN 13 13  CREATININE 0.98 1.24  CALCIUM 7.6* 7.8*    PT/INR:  Recent Labs    11/29/20 1858  LABPROT 15.0  INR 1.2   ABG    Component Value Date/Time   PHART 7.309 (L) 11/30/2020 0043   HCO3 22.8 11/30/2020 0043   TCO2 24 11/30/2020 0043   ACIDBASEDEF 3.0 (H) 11/30/2020 0043   O2SAT 97.0 11/30/2020 0043   CBG (last 3)  Recent Labs    12/01/20 0004 12/01/20 0443 12/01/20 0646  GLUCAP 92 128* 137*   CXR: left basilar atelectasis  Assessment/Plan: S/P Procedure(s) (LRB): CORONARY ARTERY BYPASS GRAFTING (CABG) TIMES ONE, ON PUMP, USING LEFT INTERNAL MAMMARY ARTERY  (N/A) TRANSESOPHAGEAL ECHOCARDIOGRAM (TEE) (N/A)  POD 2  Hemodynamically stable in sinus rhythm. Start Lopressor.  Volume excess: wt is 5 lbs over preop. Continue diuretic.  DC sleeve and foley.  Continue ASA and plan to add Plavix once pacing wires out.  DM: glucose under good control. Continue Levemir and SSI. Plan home on previous oral agents.  Start Lipitor.   LOS: 2 days    Alleen Borne 12/01/2020

## 2020-12-02 LAB — GLUCOSE, CAPILLARY
Glucose-Capillary: 135 mg/dL — ABNORMAL HIGH (ref 70–99)
Glucose-Capillary: 146 mg/dL — ABNORMAL HIGH (ref 70–99)
Glucose-Capillary: 171 mg/dL — ABNORMAL HIGH (ref 70–99)
Glucose-Capillary: 166 mg/dL — ABNORMAL HIGH (ref 70–99)

## 2020-12-02 LAB — LIPID PANEL
Cholesterol: 107 mg/dL (ref 0–200)
HDL: 16 mg/dL — ABNORMAL LOW (ref >40)
LDL Cholesterol: 63 mg/dL (ref 0–99)
Total CHOL/HDL Ratio: 6.7 RATIO
Triglycerides: 139 mg/dL (ref <150)
VLDL: 28 mg/dL (ref 0–40)

## 2020-12-02 LAB — BASIC METABOLIC PANEL
Anion gap: 8 (ref 5–15)
BUN: 12 mg/dL (ref 6–20)
CO2: 26 mmol/L (ref 22–32)
Calcium: 8.7 mg/dL — ABNORMAL LOW (ref 8.9–10.3)
Chloride: 101 mmol/L (ref 98–111)
Creatinine, Ser: 1.11 mg/dL (ref 0.61–1.24)
GFR, Estimated: >60 mL/min (ref >60)
Glucose, Bld: 127 mg/dL — ABNORMAL HIGH (ref 70–99)
Potassium: 3.9 mmol/L (ref 3.5–5.1)
Sodium: 135 mmol/L (ref 135–145)

## 2020-12-02 LAB — MAGNESIUM: Magnesium: 2.2 mg/dL (ref 1.7–2.4)

## 2020-12-02 MED ORDER — AMIODARONE HCL IN DEXTROSE 360-4.14 MG/200ML-% IV SOLN
60.0000 mg/h | INTRAVENOUS | Status: AC
  Administered 2020-12-02: 60 mg/h via INTRAVENOUS

## 2020-12-02 MED ORDER — AMIODARONE HCL IN DEXTROSE 360-4.14 MG/200ML-% IV SOLN: 30.0000 mg/h | INTRAVENOUS | Status: DC

## 2020-12-02 MED ORDER — AMIODARONE LOAD VIA INFUSION
150.0000 mg | Freq: Once | INTRAVENOUS | Status: AC
  Filled 2020-12-02: qty 83.34

## 2020-12-02 MED ORDER — AMIODARONE HCL 200 MG PO TABS
400.0000 mg | ORAL_TABLET | Freq: Two times a day (BID) | ORAL | Status: DC
  Administered 2020-12-02 – 2020-12-04 (×4): 400 mg via ORAL
  Filled 2020-12-02 (×4): qty 2

## 2020-12-02 MED ORDER — ASPIRIN EC 81 MG PO TBEC
81.0000 mg | DELAYED_RELEASE_TABLET | Freq: Every day | ORAL | Status: DC
  Administered 2020-12-03 – 2020-12-04 (×2): 81 mg via ORAL
  Filled 2020-12-02 (×2): qty 1

## 2020-12-02 MED ORDER — POTASSIUM CHLORIDE CRYS ER 20 MEQ PO TBCR
20.0000 meq | EXTENDED_RELEASE_TABLET | Freq: Three times a day (TID) | ORAL | Status: AC
  Administered 2020-12-02 (×3): 20 meq via ORAL
  Filled 2020-12-02 (×3): qty 1

## 2020-12-02 MED ORDER — AMIODARONE HCL IN DEXTROSE 360-4.14 MG/200ML-% IV SOLN
60.0000 mg/h | INTRAVENOUS | Status: DC
  Administered 2020-12-02: 60 mg/h via INTRAVENOUS
  Filled 2020-12-02: qty 200

## 2020-12-02 MED ORDER — CLOPIDOGREL BISULFATE 75 MG PO TABS
75.0000 mg | ORAL_TABLET | Freq: Every day | ORAL | Status: DC
  Administered 2020-12-02 – 2020-12-04 (×3): 75 mg via ORAL
  Filled 2020-12-02 (×3): qty 1

## 2020-12-02 MED ORDER — AMIODARONE HCL IN DEXTROSE 360-4.14 MG/200ML-% IV SOLN
30.0000 mg/h | INTRAVENOUS | Status: DC
  Administered 2020-12-02 (×2): 30 mg/h via INTRAVENOUS
  Filled 2020-12-02: qty 200

## 2020-12-02 MED ORDER — AMIODARONE HCL IN DEXTROSE 360-4.14 MG/200ML-% IV SOLN
INTRAVENOUS | Status: AC
  Administered 2020-12-02: 150 mg via INTRAVENOUS
  Filled 2020-12-02: qty 200

## 2020-12-02 NOTE — Progress Notes (Signed)
Pt's MEWS score is red due to RR 30, Atrial fib HR 114-123.  BP stable,Temp 99.9-99.3 F. SPO2 94-96% on room air. Auscultation had diminished left lower and rhonchi left upper lobe. Right lung clear auscultated. We are concerning that Pt may need chest x-ray this am. Sharon,incharge nurse,  Onalee Hua, RRT and Dr. Cliffton Asters made aware. No new order at this time. Recommended to wait for morning round evaluation. We will continue to monitor.  Filiberto Pinks, RN

## 2020-12-02 NOTE — Progress Notes (Signed)
EKG showed Atrial fib with RVR, HR 120-140 while Pt was sleeping. BP within normal limits. Temp 100.0 F. No respiratory distress, SPO2 95-98% on room air. Dr. Cliffton Asters was notified. Order received for Amiodarone bolus 150 mg and then continuously drip per A-fib protocol. We will continue to monitor.  Filiberto Pinks, RN

## 2020-12-02 NOTE — Progress Notes (Addendum)
      [address]       [place] [address]             [phone]      3 Days Post-Op Procedure(s) (LRB): CORONARY ARTERY BYPASS GRAFTING (CABG) TIMES ONE, ON PUMP, USING LEFT INTERNAL MAMMARY ARTERY (N/A) TRANSESOPHAGEAL ECHOCARDIOGRAM (TEE) (N/A) Subjective: Awake and alert, sitting up on the side of his bed. Doing well with ambulation and remains on RA.   Developed atrial fibrillation early this AM with VR 120-140's. Started on IV amiodarone load and has converted back to SR.   No BM yet.  Objective: Vital signs in last 24 hours: Temp:  [98 F (36.7 C)-100 F (37.8 C)] 98.5 F (36.9 C) (05/24 0600) Pulse Rate:  [83-135] 83 (05/24 0600) Cardiac Rhythm: Normal sinus rhythm (05/24 0530) Resp:  [18-31] 30 (05/24 0600) BP: (114-159)/(62-92) 119/75 (05/24 0600) SpO2:  [95 %-100 %] 95 % (05/24 0600) FiO2 (%):  [21 %] 21 % (05/23 0938) Weight:  [103.1 kg] 103.1 kg (05/24 0400)     Intake/Output from previous day: 05/23 0701 - 05/24 0700 In: 1170.3 [P.O.:980; I.V.:190.3] Out: 1950 [Urine:1950] Intake/Output this shift: No intake/output data recorded.  General appearance: alert, cooperative and no distress Neurologic: intact Heart: regular rate and rhythm Lungs: clear to auscultation bilaterally Abdomen: soft and NT Extremities: no peripheral edema Wound: the sternotomy is open to air and is clean and dry.   Lab Results: Recent Labs    11/30/20 1445 12/01/20 0346  WBC 9.9 9.5  HGB 9.7* 10.2*  HCT 31.6* 32.9*  PLT 211 205   BMET:  Recent Labs    11/30/20 1445 12/01/20 0346  NA 135 134*  K 4.4 4.1  CL 109 104  CO2 23 24  GLUCOSE 128* 126*  BUN 13 13  CREATININE 0.98 1.24  CALCIUM 7.6* 7.8*    PT/INR:  Recent Labs    11/29/20 1858  LABPROT 15.0  INR 1.2   ABG    Component Value Date/Time   PHART 7.309 (L) 11/30/2020 0043   HCO3 22.8 11/30/2020 0043   TCO2 24 11/30/2020 0043   ACIDBASEDEF 3.0 (H) 11/30/2020 0043   O2SAT  97.0 11/30/2020 0043   CBG (last 3)  Recent Labs    12/01/20 1653 12/01/20 2119 12/02/20 0616  GLUCAP 110* 110* 135*    Assessment/Plan: S/P Procedure(s) (LRB): CORONARY ARTERY BYPASS GRAFTING (CABG) TIMES ONE, ON PUMP, USING LEFT INTERNAL MAMMARY ARTERY (N/A) TRANSESOPHAGEAL ECHOCARDIOGRAM (TEE) (N/A)  -POD-3 CABG x 1 after late presentation of acute inferior STEMI. Progressing well over all. Continue ASA, beta blocker, statin. Add Plavix after pacer wires are out.   -Post-op atrial fibrillation- converted quickly back to SR with initial IV amiodarone loading. Will complete current IV bag and start the oral amiodarone this evening. Check lytes, Mg++ and correct if needed.   -History of type 2 DM- controlled on Levemir and SSI.   -Disposition- Plan for discharge home 1-2 days if rhythm stable. D/C pacer wires today.     LOS: 3 days    Parke Poisson [phone] 12/02/2020   Chart reviewed, patient examined, agree with above. Atrial fib with RVR this am, converted quickly with amio. Transition to po later today. I added some extra K+ today since he diuresed a lot with lasix.

## 2020-12-02 NOTE — Discharge Instructions (Signed)
Discharge Instructions: ° °1. You may shower, please wash incisions daily with soap and water and keep dry.  If you wish to cover wounds with dressing you may do so but please keep clean and change daily.  No tub baths or swimming until incisions have completely healed.  If your incisions become red or develop any drainage please call our office at [phone] ° °2. No Driving until cleared by [facility] office and you are no longer using narcotic pain medications ° °3. Monitor your weight daily.. Please use the same scale and weigh at same time... If you gain 5-10 lbs in 48 hours with associated lower extremity swelling, please contact our office at [phone] ° °4. Fever of 101.5 for at least 24 hours with no source, please contact our office at [phone] ° °5. Activity- up as tolerated, please walk at least 3 times per day.  Avoid strenuous activity, no lifting, pushing, or pulling with your arms over 8-10 lbs for a minimum of 6 weeks ° °6. If any questions or concerns arise, please do not hesitate to contact our office at [phone] ° ° °

## 2020-12-02 NOTE — Progress Notes (Signed)
CARDIAC REHAB PHASE I   PRE:  Rate/Rhythm: 87 SR  BP:  Supine:   Sitting: 114/82  Standing:    SaO2: 97%RA  MODE:  Ambulation: 470 ft   POST:  Rate/Rhythm: 93 SR PACs  BP:  Supine:   Sitting: 139/87  Standing:    SaO2: 95%RA [id number] Pt walked 470 ft on RA with rolling walker with steady gait. Pt grunting during walk as he is still having chest discomfort from incision. Remained in NSR during walk. Back to recliner with call bell.    Luetta Nutting, RN BSN  12/02/2020 9:37 AM

## 2020-12-02 NOTE — Progress Notes (Signed)
EKG is converted back to NSR. HR 90, BP stable. Will monitor.  Filiberto Pinks, RN

## 2020-12-02 NOTE — Discharge Summary (Signed)
Physician Discharge Summary  Patient ID: Allen Walton MRN: [id number] DOB/AGE: 11/15/1970 50 y.o.  Admit date: 11/29/2020 Discharge date: 12/04/2020  Admission Diagnoses:  Inferior ST Elevation Myocardial Infarction Type 2 Diabetes Mellitus Tobacco Use   Discharge Diagnoses:   Inferior ST Elevation Myocardial Infarction Type 2 Diabetes Mellitus Tobacco Use S/P CABG x 1 Post Operative Atrial Fibrillation   Discharged Condition: stable  History of Present Illness   The patient is a 50 year old gentleman with history of diabetes and 1/2 pack/day smoking who developed onset of substernal chest pain around 8 AM this morning after breakfast.  The pain felt like heartburn and was radiating to his left arm with associated shortness of breath.  He was seen at [facility] emergency department and found to have an elevated troponin and electrocardiogram showing ST elevation in the inferior leads and V4 to V6 consistent with STEMI.  He was transferred to [facility] and taken the Cath Lab by Dr. Allyson Sabal showing a ruptured plaque in the ostium of the LAD just beyond the left main with TIMI II flow.  The degree of stenosis was estimated at 95%.  Left ventricular function was preserved.  The patient had minimal residual pain in the Cath Lab.  Dr. Allyson Sabal and I felt the best option was to take the patient the operating room emergently for a left internal mammary graft to the LAD. This was offered to Mr. Allen Walton and he elected to proceed.   Course in Hospital  [name] was taken to the operating room urgently and single-vessel coronary bypass was accomplished with the left internal mammary artery grafted to the left anterior descending coronary artery. Following the procedure, he separated from cardiopulmonary bypass without difficulty and without requiring inotropic support.  He was transferred to the ICU in stable condition.  He was weaned from mechanical ventilatory support and extubated within a  few hour of surgery.  He was mobilized and progressed will with ambulation. Tubes and monitoring lines were removed routinely. He was transferred to 4E progressive care on post-op day 2. He developed atrial fibrillation in the early morning of post op day 3 and was loaded with IV amiodarone per protocol. He quickly converted to normal sinus rhythm and the amiodarone was transitioned to oral medication. He progressed well with ambulation and was easily weaned off the supplemental O2.   Consults: None  Significant Diagnostic Studies:   CLINICAL DATA:  History of CABG.  EXAM: PORTABLE CHEST 1 VIEW  COMPARISON:  11/30/2020  FINDINGS: Pulmonary arterial catheter has been removed. Right jugular central line is still present. Chest drains have been removed. Negative for pneumothorax. Hazy and patchy densities at the left lung base are suggestive for atelectasis and possibly pleural fluid. Heart size is stable with median sternotomy wires.  IMPRESSION: 1. Hazy and patchy densities at the left lung base. Findings are suggestive for atelectasis and possibly a small amount of pleural fluid. 2. Removal of chest drains without a pneumothorax.   Electronically Signed   By: Richarda Overlie M.D.   On: 12/01/2020 08:00  Treatments:   CARDIOVASCULAR SURGERY OPERATIVE NOTE  11/29/2020  Surgeon:  Alleen Borne, MD  [place] t: Jillyn Hidden,  PA-C   Preoperative Diagnosis:  Severe single-vessel coronary artery disease   Postoperative Diagnosis:  Same   Procedure:  1. Emergent Median Sternotomy 2. Extracorporeal circulation 3.   Coronary artery bypass grafting x 1   Left internal mammary artery graft to the LAD    Anesthesia:  General Endotracheal   Clinical History/Surgical Indication:  This 50 year old gentleman has a high-grade ostial LAD stenosis due to ruptured plaque presenting with an inferior STEMI due to a large LAD wraparound apex. I  agree that the best treatment for this patient is a left internal mammary graft to LAD. Given the appearance of this lesion I think this needs to be done emergently. He has minimal chest discomfort at this time and had correction of his EKG changes. I discussed the operative procedure with the patient andhis wifeincluding alternatives, benefits and risks; including but not limited to bleeding, blood transfusion, infection, stroke, myocardial infarction, graft failure, heart block requiring a permanent pacemaker, organ dysfunction, and death. Benjaman Pott and agrees to proceed.   Discharge Exam: Blood pressure 111/67, pulse 80, temperature 99.1 F (37.3 C), temperature source Oral, resp. rate 20, height 6\' 1"  (1.854 m), weight 101.4 kg, SpO2 98 %.   General appearance: alert, cooperative and no distress Neurologic: intact Heart: regular rate and rhythm, no a-fib past 48 hours.  Lungs: clear to auscultation bilaterally Abdomen: soft and NT Extremities: no peripheral edema Wound: the sternotomy is open to air and is clean and dry.   Disposition: discharged to home in stable condition  Discharge Instructions    Amb Referral to Cardiac Rehabilitation   Complete by: As directed    Diagnosis:  CABG STEMI     CABG X ___: 1   After initial evaluation and assessments completed: Virtual Based Care may be provided alone or in conjunction with Phase 2 Cardiac Rehab based on patient barriers.: Yes     Allergies as of 12/04/2020   No Known Allergies     Medication List    TAKE these medications   amiodarone 200 MG tablet Commonly known as: PACERONE Take 2 tablets (400 mg total) by mouth 2 (two) times daily. Then reduce the dose to 1 tablet (200mg ) by mouth twice daily.   aspirin 81 MG EC tablet Take 1 tablet (81 mg total) by mouth daily. Swallow whole.   atorvastatin 80 MG tablet Commonly known as: LIPITOR Take 1 tablet (80 mg total) by mouth daily.   clopidogrel 75 MG  tablet Commonly known as: PLAVIX Take 1 tablet (75 mg total) by mouth daily.   glipiZIDE 5 MG tablet Commonly known as: GLUCOTROL Take 5 mg by mouth 2 (two) times daily before a meal.   metFORMIN 1000 MG tablet Commonly known as: GLUCOPHAGE Take 1,000 mg by mouth 2 (two) times daily with a meal.   metoprolol tartrate 25 MG tablet Commonly known as: LOPRESSOR Take 1 tablet (25 mg total) by mouth 2 (two) times daily.   oxyCODONE 5 MG immediate release tablet Commonly known as: Oxy IR/ROXICODONE Take 1 tablet (5 mg total) by mouth every 3 (three) hours as needed for up to 5 days for severe pain.       Follow-up Information    [facility]. Go on 12/10/2020.   Specialty: Cardiothoracic Surgery Why: Your appointment for suture removal is at 11:00am.  Contact information: [address] [place], [address] [place] [phone]       [id number], [phone]. Go on 12/24/2020.   Specialties: Physician Assistant, Cardiology, Radiology Why: Your appointment is at 9:15am.  Contact information: [address] [place] [address] [place] [phone]        [id number], MD. Go on 01/07/2021.   Specialty: Cardiothoracic Surgery Why: Your appointment is at 12  Kathy Breach arrive 30 minutes early for a chest x-ray to be performed by [facility] located on the first floor of the same building.  Contact information: [address] [employer] [address] [place] [address] [phone]             The patient has been discharged on:   1.Beta Blocker:  Yes [ x  ]                              No   [   ]                              If No, reason:  2.Ace Inhibitor/ARB: Yes [   ]                                     No  [  x  ]                                     If No, reason: low BP  3.Statin:   Yes [ x  ]                  No  [   ]                  If No, reason:  4.Ecasa:  Yes  [ x  ]                   No   [   ]                  If No, reason:     Signed: Leary Roca, PA-C 12/04/2020, 7:31 AM

## 2020-12-02 NOTE — Progress Notes (Signed)
Epicardial pacer wires removed. All ends intact. VS stable. Pt tolerated well.

## 2020-12-02 NOTE — Progress Notes (Signed)
Mobility Specialist: Progress Note   12/02/20 1212  Mobility  Activity Ambulated in hall  Level of Assistance Standby assist, set-up cues, supervision of patient - no hands on  Assistive Device Front wheel walker  Distance Ambulated (ft) 470 ft  Mobility Ambulated with assistance in hallway  Mobility Response Tolerated well  Mobility performed by Mobility specialist  Bed Position Chair  $Mobility charge 1 Mobility   Pre-Mobility: 84 HR During Mobility: 91 HR Post-Mobility: 79 HR, 150/91 BP, 100% SpO2  Pt asx during ambulation. Pt did side step when turning around at each end of the hallway, able to recover independently. Pt back to recliner after walk with call bell at his side.   [facility] Bostyn Kunkler Mobility Specialist Mobility Specialist Phone: [phone]

## 2020-12-03 LAB — TYPE AND SCREEN
ABO/RH(D): B POS
Antibody Screen: NEGATIVE
Unit division: 0
Unit division: 0

## 2020-12-03 LAB — ECHO INTRAOPERATIVE TEE
AR max vel: 2.29 cm2
AV Area VTI: 2.11 cm2
AV Area mean vel: 2.04 cm2
AV Mean grad: 4 mmHg
AV Peak grad: 6.3 mmHg
Ao pk vel: 1.25 m/s
Area-P 1/2: 4.77 cm2
Height: 73 in
MV VTI: 3.94 cm2
S' Lateral: 2.93 cm
Weight: 3520 oz

## 2020-12-03 LAB — GLUCOSE, CAPILLARY
Glucose-Capillary: 125 mg/dL — ABNORMAL HIGH (ref 70–99)
Glucose-Capillary: 102 mg/dL — ABNORMAL HIGH (ref 70–99)
Glucose-Capillary: 139 mg/dL — ABNORMAL HIGH (ref 70–99)
Glucose-Capillary: 137 mg/dL — ABNORMAL HIGH (ref 70–99)

## 2020-12-03 LAB — BPAM RBC
Blood Product Expiration Date: 202206132359
Blood Product Expiration Date: 202206132359
ISSUE DATE / TIME: 202205211547
ISSUE DATE / TIME: 202205211547
Unit Type and Rh: 7300
Unit Type and Rh: 7300

## 2020-12-03 MED ORDER — POLYETHYLENE GLYCOL 3350 17 G PO PACK
17.0000 g | PACK | Freq: Once | ORAL | Status: AC
  Administered 2020-12-03: 17 g via ORAL
  Filled 2020-12-03: qty 1

## 2020-12-03 MED FILL — Electrolyte-R (PH 7.4) Solution: INTRAVENOUS | Qty: 3000 | Status: AC

## 2020-12-03 MED FILL — Sodium Bicarbonate IV Soln 8.4%: INTRAVENOUS | Qty: 50 | Status: AC

## 2020-12-03 MED FILL — Heparin Sodium (Porcine) Inj 1000 Unit/ML: INTRAMUSCULAR | Qty: 10 | Status: AC

## 2020-12-03 MED FILL — Sodium Chloride IV Soln 0.9%: INTRAVENOUS | Qty: 3000 | Status: AC

## 2020-12-03 MED FILL — Lidocaine HCl Local Soln Prefilled Syringe 100 MG/5ML (2%): INTRAMUSCULAR | Qty: 5 | Status: AC

## 2020-12-03 MED FILL — Mannitol IV Soln 20%: INTRAVENOUS | Qty: 500 | Status: AC

## 2020-12-03 NOTE — Progress Notes (Addendum)
      [address]       [place] [address]             [phone]      4 Days Post-Op Procedure(s) (LRB): CORONARY ARTERY BYPASS GRAFTING (CABG) TIMES ONE, ON PUMP, USING LEFT INTERNAL MAMMARY ARTERY (N/A) TRANSESOPHAGEAL ECHOCARDIOGRAM (TEE) (N/A) Subjective: Awake and alert, sitting up in the bedside chair having breakfast. No concerns, pain well controlled.  No further atrial fibrillation No BM yet.  Objective: Vital signs in last 24 hours: Temp:  [98.1 F (36.7 C)-99.1 F (37.3 C)] 98.8 F (37.1 C) (05/25 0331) Pulse Rate:  [74-90] 74 (05/25 0331) Cardiac Rhythm: Normal sinus rhythm (05/24 1900) Resp:  [17-20] 20 (05/25 0331) BP: (102-136)/(61-82) 102/61 (05/25 0331) SpO2:  [96 %-99 %] 98 % (05/25 0331) Weight:  [101.4 kg] 101.4 kg (05/25 0331)     Intake/Output from previous day: 05/24 0701 - 05/25 0700 In: -  Out: 1450 [Urine:1450] Intake/Output this shift: No intake/output data recorded.  General appearance: alert, cooperative and no distress Neurologic: intact Heart: regular rate and rhythm, no further a-fib past 24 hours. Frequent PAC's have also resolved.  Lungs: clear to auscultation bilaterally Abdomen: soft and NT Extremities: no peripheral edema Wound: the sternotomy is open to air and is clean and dry.   Lab Results: Recent Labs    11/30/20 1445 12/01/20 0346  WBC 9.9 9.5  HGB 9.7* 10.2*  HCT 31.6* 32.9*  PLT 211 205   BMET:  Recent Labs    12/01/20 0346 12/02/20 0807  NA 134* 135  K 4.1 3.9  CL 104 101  CO2 24 26  GLUCOSE 126* 127*  BUN 13 12  CREATININE 1.24 1.11  CALCIUM 7.8* 8.7*    PT/INR:  No results for input(s): LABPROT, INR in the last 72 hours. ABG    Component Value Date/Time   PHART 7.309 (L) 11/30/2020 0043   HCO3 22.8 11/30/2020 0043   TCO2 24 11/30/2020 0043   ACIDBASEDEF 3.0 (H) 11/30/2020 0043   O2SAT 97.0 11/30/2020 0043   CBG (last 3)  Recent Labs    12/02/20 1631 12/02/20 2111  12/03/20 0616  GLUCAP 171* 166* 125*    Assessment/Plan: S/P Procedure(s) (LRB): CORONARY ARTERY BYPASS GRAFTING (CABG) TIMES ONE, ON PUMP, USING LEFT INTERNAL MAMMARY ARTERY (N/A) TRANSESOPHAGEAL ECHOCARDIOGRAM (TEE) (N/A)  -POD-4 CABG x 1 after late presentation of acute inferior STEMI. Progressing well over all. Continue ASA, beta blocker, statin, and Plavix.  Wt is 3Lbs+ but does not appear to have significant volume excess on exam.   -Post-op atrial fibrillation- converted to SR with initial IV amiodarone loading. Continue oral amiodarone. Given extra K+ yesterday, Mg++ was 2.2.  -History of type 2 DM- controlled on Levemir and SSI. Plan to resume his oral medications at home.   -Disposition- Plan for discharge after bowel function returns. Try a dose of Mirilax this morning.      LOS: 4 days    Parke Poisson [phone] 12/03/2020    Chart reviewed, patient examined, agree with above. Rhythm is remaining sinus on oral amiodarone. If it stays stable today we will send home tomorrow.

## 2020-12-03 NOTE — Plan of Care (Signed)
  Problem: Education: Goal: Knowledge of General Education information will improve Description Including pain rating scale, medication(s)/side effects and non-pharmacologic comfort measures Outcome: Progressing   

## 2020-12-03 NOTE — Progress Notes (Signed)
CARDIAC REHAB PHASE I   PRE:  Rate/Rhythm: 74 R  BP:  Supine:   Sitting: 117/71  Standing:    SaO2: 98%RA  MODE:  Ambulation: 700 ft   POST:  Rate/Rhythm: 77 SR  BP:  Supine:   Sitting: 132/72  Standing:    SaO2: 94%RA 0957-1025 Pt walked 700 ft on RA with hand held asst and steady gait. Tolerated well. Wife had questions re showering and wound care which I reviewed. Discussed CRP 2 and referral to [facility] program. To recliner after walk.    Luetta Nutting, RN BSN  12/03/2020 10:23 AM

## 2020-12-03 NOTE — Progress Notes (Signed)
Mobility Specialist: Progress Note   12/03/20 1455  Mobility  Activity Ambulated in hall  Level of Assistance Independent  Assistive Device None  Distance Ambulated (ft) 650 ft  Mobility Ambulated independently in hallway  Mobility Response Tolerated well  Mobility performed by Mobility specialist  $Mobility charge 1 Mobility   Pre-Mobility: 84 HR Post-Mobility: 80 HR, 142/81 BP  Pt stopped for a brief standing break halfway through ambulation due to feeling SOB, otherwise asx. Pt sitting EOB after walk with call bell at his side.   [facility] Merlie Noga Mobility Specialist Mobility Specialist Phone: [phone]

## 2020-12-03 NOTE — Progress Notes (Signed)
Mobility Specialist: Progress Note   12/03/20 1813  Mobility  Activity Ambulated in hall  Level of Assistance Independent  Assistive Device None  Distance Ambulated (ft) 1000 ft  Mobility Ambulated independently in hallway  Mobility Response Tolerated well  Mobility performed by Mobility specialist  Bed Position Chair  $Mobility charge 1 Mobility   Pt asx during ambulation. Pt to recliner after walk to sit up and eat dinner. Call bell is on his table.   [facility] Chrisanne Loose Mobility Specialist Mobility Specialist Phone: [phone]

## 2020-12-04 ENCOUNTER — Other Ambulatory Visit (HOSPITAL_COMMUNITY): Payer: Self-pay

## 2020-12-04 LAB — GLUCOSE, CAPILLARY: Glucose-Capillary: 130 mg/dL — ABNORMAL HIGH (ref 70–99)

## 2020-12-04 MED ORDER — CLOPIDOGREL BISULFATE 75 MG PO TABS: 75.0000 mg | ORAL_TABLET | Freq: Every day | ORAL | 11 refills | Status: DC

## 2020-12-04 MED ORDER — AMIODARONE HCL 200 MG PO TABS
400.0000 mg | ORAL_TABLET | Freq: Two times a day (BID) | ORAL | 1 refills | Status: DC
  Filled 2020-12-04: qty 60, 20d supply, fill #0

## 2020-12-04 MED ORDER — ASPIRIN 81 MG PO TBEC: 81.0000 mg | DELAYED_RELEASE_TABLET | Freq: Every day | ORAL | 11 refills | Status: DC

## 2020-12-04 MED ORDER — ATORVASTATIN CALCIUM 80 MG PO TABS: 80.0000 mg | ORAL_TABLET | Freq: Every day | ORAL | 2 refills | Status: DC

## 2020-12-04 MED ORDER — ATORVASTATIN CALCIUM 80 MG PO TABS
80.0000 mg | ORAL_TABLET | Freq: Every day | ORAL | 2 refills | Status: AC
  Filled 2020-12-04: qty 30, 30d supply, fill #0

## 2020-12-04 MED ORDER — OXYCODONE HCL 5 MG PO TABS: 5.0000 mg | ORAL_TABLET | ORAL | 0 refills | Status: DC | PRN

## 2020-12-04 MED ORDER — METOPROLOL TARTRATE 25 MG PO TABS
25.0000 mg | ORAL_TABLET | Freq: Two times a day (BID) | ORAL | 2 refills | Status: DC
  Filled 2020-12-04: qty 60, 30d supply, fill #0

## 2020-12-04 MED ORDER — ASPIRIN 81 MG PO TBEC
81.0000 mg | DELAYED_RELEASE_TABLET | Freq: Every day | ORAL | 11 refills | Status: AC
Start: 2020-12-04 — End: ?
  Filled 2020-12-04: qty 30, 30d supply, fill #0

## 2020-12-04 MED ORDER — METOPROLOL TARTRATE 25 MG PO TABS: 25.0000 mg | ORAL_TABLET | Freq: Two times a day (BID) | ORAL | 2 refills | Status: DC

## 2020-12-04 MED ORDER — OXYCODONE HCL 5 MG PO TABS
5.0000 mg | ORAL_TABLET | ORAL | 0 refills | Status: AC | PRN
  Filled 2020-12-04: qty 24, 4d supply, fill #0

## 2020-12-04 MED ORDER — AMIODARONE HCL 200 MG PO TABS: 400.0000 mg | ORAL_TABLET | Freq: Two times a day (BID) | ORAL | 1 refills | Status: DC

## 2020-12-04 NOTE — Plan of Care (Signed)
  Problem: Education: Goal: Knowledge of General Education information will improve Description: Including pain rating scale, medication(s)/side effects and non-pharmacologic comfort measures Outcome: Completed/Met

## 2020-12-04 NOTE — Progress Notes (Signed)
[id number] Education completed with pt and wife who voiced understanding. Reinforced sternal precautions and staying in the tube. Pt still needs reminding. Discussed IS, walking for ex, gave diabetic and heart healthy diets and wound care. Referral to [facility] CRP 2 has been done encouraged smoking cessation. Pt has handout.Luetta Nutting RN BSN 12/04/2020 10:21 AM

## 2020-12-04 NOTE — Progress Notes (Signed)
Mobility Specialist: Progress Note   12/04/20 1047  Mobility  Activity Refused mobility   Pt refused mobility stating he will walk when he gets home, RN present in room.   [facility] Joyice Magda Mobility Specialist Mobility Specialist Phone: [phone]

## 2020-12-04 NOTE — Progress Notes (Addendum)
      [address]       [place] [address]             [phone]      5 Days Post-Op Procedure(s) (LRB): CORONARY ARTERY BYPASS GRAFTING (CABG) TIMES ONE, ON PUMP, USING LEFT INTERNAL MAMMARY ARTERY (N/A) TRANSESOPHAGEAL ECHOCARDIOGRAM (TEE) (N/A) Subjective: Feels good, no concerns. Feels he is ready to return home today.  No further atrial fibrillation  BM yesterday  Objective: Vital signs in last 24 hours: Temp:  [98.4 F (36.9 C)-99.2 F (37.3 C)] 99.1 F (37.3 C) (05/26 0044) Pulse Rate:  [76-86] 80 (05/26 0044) Cardiac Rhythm: Normal sinus rhythm (05/25 2330) Resp:  [16-20] 20 (05/26 0044) BP: (111-122)/(66-70) 111/67 (05/26 0044) SpO2:  [95 %-98 %] 98 % (05/26 0044)     Intake/Output from previous day: 05/25 0701 - 05/26 0700 In: -  Out: 575 [Urine:575] Intake/Output this shift: No intake/output data recorded.  General appearance: alert, cooperative and no distress Neurologic: intact Heart: regular rate and rhythm, no a-fib past 48 hours.  Lungs: clear to auscultation bilaterally Abdomen: soft and NT Extremities: no peripheral edema Wound: the sternotomy is open to air and is clean and dry.   Lab Results: No results for input(s): WBC, HGB, HCT, PLT in the last 72 hours. BMET:  Recent Labs    12/02/20 0807  NA 135  K 3.9  CL 101  CO2 26  GLUCOSE 127*  BUN 12  CREATININE 1.11  CALCIUM 8.7*    PT/INR:  No results for input(s): LABPROT, INR in the last 72 hours. ABG    Component Value Date/Time   PHART 7.309 (L) 11/30/2020 0043   HCO3 22.8 11/30/2020 0043   TCO2 24 11/30/2020 0043   ACIDBASEDEF 3.0 (H) 11/30/2020 0043   O2SAT 97.0 11/30/2020 0043   CBG (last 3)  Recent Labs    12/03/20 1608 12/03/20 2120 12/04/20 0640  GLUCAP 139* 137* 130*    Assessment/Plan: S/P Procedure(s) (LRB): CORONARY ARTERY BYPASS GRAFTING (CABG) TIMES ONE, ON PUMP, USING LEFT INTERNAL MAMMARY ARTERY (N/A) TRANSESOPHAGEAL ECHOCARDIOGRAM  (TEE) (N/A)  -POD-5 CABG x 1 after late presentation of acute inferior STEMI. Progressing well over all. Continue ASA, beta blocker, statin, and Plavix.  Ready for discharge  -Post-op atrial fibrillation- maintaining SR on oral amiodarone.   -History of type 2 DM- controlled on Levemir and SSI. Plan to resume his oral medications at home.   -Disposition- Plan for discharge today.    LOS: 5 days    Parke Poisson [phone] 12/04/2020    Chart reviewed, patient examined, agree with above. He looks good and feels well. Rhythm is stable in sinus or oral amiodarone and Lopressor. Plan home today.

## 2020-12-04 NOTE — Progress Notes (Signed)
Pt discharged pr MD order. IV an tele removed. Discharge instructions reviewed with pt and his wife. Questions answered to his satisfaction. Pt escorted to personal vehicle.    Calissa Swenor M

## 2020-12-10 ENCOUNTER — Ambulatory Visit (INDEPENDENT_AMBULATORY_CARE_PROVIDER_SITE_OTHER): Payer: Self-pay | Admitting: *Deleted

## 2020-12-10 ENCOUNTER — Other Ambulatory Visit: Payer: Self-pay

## 2020-12-10 DIAGNOSIS — Z4802 Encounter for removal of sutures: Secondary | ICD-10-CM

## 2020-12-10 NOTE — Progress Notes (Signed)
Patient arrived for nurse visit to remove sutures post-CABG 5/21.  Three sutures removed with no signs or symptoms of infection noted.  Incisions well approximated. Patient tolerated suture removal well.  Patient c/o incisional soreness. Advised patient soreness is to be expected and should decrease with time. Advised patient to continue taking pain medications and prescribed.  Patient and family instructed to keep the incision site clean and dry. Patient and family acknowledged instructions given.  All questions answered.

## 2020-12-10 NOTE — Progress Notes (Signed)
Cardiology Office Note:    Date:  12/24/2020   ID:  Allen Walton, DOB 06-Jul-1971, MRN [id number]  PCP:  Pcp, No  Cardiologist:  Nanetta Batty, MD   Referring MD: No ref. provider found   Chief Complaint  Patient presents with   Hospitalization Follow-up    CABG    History of Present Illness:    Allen Walton is a 50 y.o. male with a hx of DM, current smoker, and CAD status post recent CABG.  He presented to [facility] ED with chest pain, elevated troponin, and ST elevation in inferior leads and V4 through 6.  He was transferred to [facility] emergently for heart catheterization.  He was found to have ruptured plaque in the ostium of the LAD just beyond the left main.  Degree of stenosis was estimated at 95%.  CT surgery was consulted and he was taken urgently to the OR for LIMA-LAD on 11/20/2020.  Course complicated by postop A. fib on POD #3.  He was loaded with IV amiodarone and quickly converted to normal sinus rhythm.  Amiodarone transition to p.o. dosing and he was discharged on 12/04/2020.   He presents today for hospital follow-up.  He is on 81 mg aspirin and Plavix, 80 mg Lipitor, and 25 mg metoprolol twice daily.  He is on amiodarone 200 mg daily.  DM managed with metformin and glipizide.    He is doing well, with some chest soreness. He is walking about 15 min daily (walking the dog). He does not have angina while walking. Chest discomfort sounds consistent with MSK soreness from sternotomy. He is on the fence about cardiac rehab and is anxious to get back to work - works in Production designer, theatre/television/film at [employer].  No palpitations, dizziness, or near syncope. No bleeding problems.    Past Medical History:  Diagnosis Date   Anginal pain (HCC)    Diabetes mellitus without complication (HCC)    Myocardial infarction [facility])     Past Surgical History:  Procedure Laterality Date   CORONARY ARTERY BYPASS GRAFT N/A 11/29/2020   Procedure: CORONARY ARTERY BYPASS GRAFTING  (CABG) TIMES ONE, ON PUMP, USING LEFT INTERNAL MAMMARY ARTERY;  Surgeon: Alleen Borne, MD;  Location: [facility] OR;  Service: Open Heart Surgery;  Laterality: N/A;   CORONARY/GRAFT ACUTE MI REVASCULARIZATION N/A 11/29/2020   Procedure: CORONARY/GRAFT ACUTE MI REVASCULARIZATION;  Surgeon: Runell Gess, MD;  Location: [facility] INVASIVE CV LAB;  Service: Cardiovascular;  Laterality: N/A;   HERNIA REPAIR     TEE WITHOUT CARDIOVERSION N/A 11/29/2020   Procedure: TRANSESOPHAGEAL ECHOCARDIOGRAM (TEE);  Surgeon: Alleen Borne, MD;  Location: [facility] OR;  Service: Open Heart Surgery;  Laterality: N/A;    Current Medications: Current Meds  Medication Sig   amiodarone (PACERONE) 200 MG tablet Take 2 tablets (400 mg total) by mouth 2 (two) times daily for 5 days then reduce the dose to 1 tablet (200mg ) by mouth twice daily.   aspirin 81 MG EC tablet Take 1 tablet (81 mg total) by mouth daily. Swallow whole.   atorvastatin (LIPITOR) 80 MG tablet Take 1 tablet (80 mg total) by mouth daily.   clopidogrel (PLAVIX) 75 MG tablet Take 1 tablet (75 mg total) by mouth daily.   glipiZIDE (GLUCOTROL) 5 MG tablet Take 5 mg by mouth 2 (two) times daily before a meal.   metFORMIN (GLUCOPHAGE) 1000 MG tablet Take 1,000 mg by mouth 2 (two) times daily with a meal.   metoprolol tartrate (LOPRESSOR)  25 MG tablet Take 1 tablet (25 mg total) by mouth 2 (two) times daily.     Allergies:   Patient has no known allergies.   Social History   Socioeconomic History   Marital status: Married    Spouse name: Not on file   Number of children: Not on file   Years of education: Not on file   Highest education level: Not on file  Occupational History   Not on file  Tobacco Use   Smoking status: Some Days    Pack years: 0.00    Types: Cigarettes   Smokeless tobacco: Never  Substance and Sexual Activity   Alcohol use: Never   Drug use: Never   Sexual activity: Not on file  Other Topics Concern   Not on file  Social History  Narrative   Not on file   Social Determinants of Health   Financial Resource Strain: Not on file  Food Insecurity: Not on file  Transportation Needs: Not on file  Physical Activity: Not on file  Stress: Not on file  Social Connections: Not on file     Family History: The patient's  family history includes Hypertension in his father.  ROS:   Please see the history of present illness.     All other systems reviewed and are negative.  EKGs/Labs/Other Studies Reviewed:    The following studies were reviewed today:  Limited echo 11/30/20  1. Limited echo for LV function, preop assessment   2. Left ventricular ejection fraction, by estimation, is 55 to 60%. The  left ventricle has normal function. The left ventricle has no regional  wall motion abnormalities. There is mild left ventricular hypertrophy.  Left ventricular diastolic parameters  were normal. There is mild hypokinesis of the left ventricular, apical  apical segment.   3. The aortic valve is tricuspid. Aortic valve regurgitation is not  visualized. No aortic stenosis is present.    Heart cath 11/29/20: Ost LAD to Prox LAD lesion is 95% stenosed.  Allen Walton has a ruptured plaque in the ostial LAD just off the left main and a huge vessel.  He has TIMI II flow.  The remainder of his coronary nanometer is free of significant disease.  He has preserved LV function with mild apical hypokinesia.  I believe the best revascularization option would be CABG with a LIMA to his LAD.  I have asked Dr. Laneta Simmers to review and he agrees.  We will keep the patient on a heparin drip.  He did receive aspirin.  He is hemodynamically stable with a blood pressure in the 120/80 range in sinus rhythm.  He is currently almost pain-free.  We talked about risk factor modification including smoking cessation.   EKG:  EKG is ordered today.  The ekg ordered today demonstrates sinus rhythm HR 62, TWI throughout similar to prior   Recent  Labs: 11/29/2020: ALT 16 12/01/2020: Hemoglobin 10.2; Platelets 205 12/02/2020: BUN 12; Creatinine, Ser 1.11; Magnesium 2.2; Potassium 3.9; Sodium 135  Recent Lipid Panel    Component Value Date/Time   CHOL 107 12/02/2020 0434   TRIG 139 12/02/2020 0434   HDL 16 (L) 12/02/2020 0434   CHOLHDL 6.7 12/02/2020 0434   VLDL 28 12/02/2020 0434   LDLCALC 63 12/02/2020 0434    Physical Exam:    VS:  BP 102/60   Pulse 62   Ht 6\' 1"  (1.854 m)   Wt 217 lb 6.4 oz (98.6 kg)   SpO2 99%   BMI  28.68 kg/m     Wt Readings from Last 3 Encounters:  12/24/20 217 lb 6.4 oz (98.6 kg)  12/03/20 223 lb 8 oz (101.4 kg)     GEN: Well nourished, well developed in no acute distress HEENT: Normal NECK: No JVD; No carotid bruits LYMPHATICS: No lymphadenopathy CARDIAC: RRR, no murmurs, rubs, gallops, sternotomy and drain sites C/D/I, right radial site C/D/I RESPIRATORY:  Clear to auscultation without rales, wheezing or rhonchi  ABDOMEN: Soft, non-tender, non-distended MUSCULOSKELETAL:  No edema; No deformity  SKIN: Warm and dry NEUROLOGIC:  Alert and oriented x 3 PSYCHIATRIC:  Normal affect   ASSESSMENT:    1. STEMI involving left anterior descending coronary artery (HCC)   2. S/P CABG x 1   3. PAF (paroxysmal atrial fibrillation) (HCC)   4. Hyperlipidemia with target LDL less than 70   5. Type 2 diabetes mellitus without complication, without long-term current use of insulin (HCC)    PLAN:    In order of problems listed above:  CAD s/p CABG with LIMA-LAD - Aspirin and Plavix, BB - pressure well controlled, no bleeding problems - no medication changes - he is cleared for cardiac rehab   Post-op Afib - converted on IV amiodarone - continue PO amiodarone - was not discharged on OAC - continue 200 mg amiodarone - hope to wean in 3-6 months - he states he was aware of RVR - EKG today in sinus rhythm   Hyperlipidemia with LDL goal less than 70 12/02/2020: Cholesterol 107; HDL 16; LDL  Cholesterol 63; Triglycerides 139; VLDL 28 -Continue 80 mg Lipitor   DM - A1c 7.3% - consider SGLT2i given CAD - will leave this to [facility] provider who manages his DM medications   Follow up in 3 months with me or Dr. Allyson Sabal.   Medication Adjustments/Labs and Tests Ordered: Current medicines are reviewed at length with the patient today.  Concerns regarding medicines are outlined above.  Orders Placed This Encounter  Procedures   EKG 12-Lead    No orders of the defined types were placed in this encounter.   Signed, Marcelino Duster, [place]  12/24/2020 9:56 AM    [facility]

## 2020-12-24 ENCOUNTER — Encounter: Payer: Self-pay | Admitting: Physician Assistant

## 2020-12-24 ENCOUNTER — Ambulatory Visit (INDEPENDENT_AMBULATORY_CARE_PROVIDER_SITE_OTHER): Payer: 59 | Admitting: Physician Assistant

## 2020-12-24 ENCOUNTER — Other Ambulatory Visit: Payer: Self-pay

## 2020-12-24 ENCOUNTER — Telehealth: Payer: Self-pay | Admitting: Physician Assistant

## 2020-12-24 VITALS — BP 102/60 | HR 62 | Ht 73.0 in | Wt 217.4 lb

## 2020-12-24 DIAGNOSIS — I2102 ST elevation (STEMI) myocardial infarction involving left anterior descending coronary artery: Secondary | ICD-10-CM

## 2020-12-24 DIAGNOSIS — Z951 Presence of aortocoronary bypass graft: Secondary | ICD-10-CM

## 2020-12-24 DIAGNOSIS — E119 Type 2 diabetes mellitus without complications: Secondary | ICD-10-CM

## 2020-12-24 DIAGNOSIS — E785 Hyperlipidemia, unspecified: Secondary | ICD-10-CM

## 2020-12-24 DIAGNOSIS — I480 Paroxysmal atrial fibrillation: Secondary | ICD-10-CM | POA: Diagnosis not present

## 2020-12-24 NOTE — Patient Instructions (Signed)
Medication Instructions:  No Changes *If you need a refill on your cardiac medications before your next appointment, please call your pharmacy*   Lab Work: No labs If you have labs (blood work) drawn today and your tests are completely normal, you will receive your results only by: MyChart Message (if you have MyChart) OR A paper copy in the mail If you have any lab test that is abnormal or we need to change your treatment, we will call you to review the results.   Testing/Procedures: No Testing   Follow-Up: At [facility], you and your health needs are our priority.  As part of our continuing mission to provide you with exceptional heart care, we have created designated Provider Care Teams.  These Care Teams include your primary Cardiologist (physician) and Advanced Practice Providers (APPs -  Physician Assistants and Nurse Practitioners) who all work together to provide you with the care you need, when you need it.  We recommend signing up for the patient portal called "MyChart".  Sign up information is provided on this After Visit Summary.  MyChart is used to connect with patients for Virtual Visits (Telemedicine).  Patients are able to view lab/test results, encounter notes, upcoming appointments, etc.  Non-urgent messages can be sent to your provider as well.   To learn more about what you can do with MyChart, go to [email or web address].    Your next appointment:   3 month(s)  The format for your next appointment:   In Person  Provider:   You may see Nanetta Batty, MD or one of the following Advanced Practice Providers on your designated Care Team:    Micah Flesher PA-C    Other Instructions Please consider Jardiance or Hendricks Limes as an addition to diabetes regiment

## 2020-12-24 NOTE — Telephone Encounter (Signed)
[facility] received forms from patient while in office for appt, forms were given to Angie on 6/15.

## 2020-12-25 NOTE — Telephone Encounter (Signed)
Forms returned from provider, Surgeon will need to complete forms. Called patient, call could not be completed.  Patient will need to come into [facility] office and pick up forms at front desk pick up bin.

## 2021-01-06 ENCOUNTER — Other Ambulatory Visit: Payer: Self-pay | Admitting: Surgery

## 2021-01-06 DIAGNOSIS — Z951 Presence of aortocoronary bypass graft: Secondary | ICD-10-CM

## 2021-01-07 ENCOUNTER — Ambulatory Visit
Admission: RE | Admit: 2021-01-07 | Discharge: 2021-01-07 | Disposition: A | Discharge status: Home / Self Care | Payer: Non-veteran care | Source: Ambulatory Visit | Attending: Surgery | Admitting: Surgery

## 2021-01-07 ENCOUNTER — Ambulatory Visit (INDEPENDENT_AMBULATORY_CARE_PROVIDER_SITE_OTHER): Payer: Self-pay | Admitting: Surgery

## 2021-01-07 ENCOUNTER — Other Ambulatory Visit: Payer: Self-pay

## 2021-01-07 ENCOUNTER — Encounter: Payer: Self-pay | Admitting: Surgery

## 2021-01-07 VITALS — BP 118/73 | HR 68 | Resp 20 | Ht 73.0 in | Wt 218.0 lb

## 2021-01-07 DIAGNOSIS — Z951 Presence of aortocoronary bypass graft: Secondary | ICD-10-CM

## 2021-01-07 NOTE — Progress Notes (Signed)
  HPI: Patient returns for routine postoperative follow-up having undergone coronary artery bypass graft surgery x1 using a left internal mammary artery graft to the LAD on 11/29/2020. The patient's early postoperative recovery while in the hospital was notable for postoperative atrial fibrillation converted with amiodarone.. Since hospital discharge the patient reports that he has been feeling well.  He is walking daily without chest pain or shortness of breath.  His stamina is improving..   Current Outpatient Medications  Medication Sig Dispense Refill   amiodarone (PACERONE) 200 MG tablet Take 2 tablets (400 mg total) by mouth 2 (two) times daily for 5 days then reduce the dose to 1 tablet (200mg ) by mouth twice daily. 60 tablet 1   aspirin 81 MG EC tablet Take 1 tablet (81 mg total) by mouth daily. Swallow whole. 30 tablet 11   atorvastatin (LIPITOR) 80 MG tablet Take 1 tablet (80 mg total) by mouth daily. 30 tablet 2   clopidogrel (PLAVIX) 75 MG tablet Take 1 tablet (75 mg total) by mouth daily. 30 tablet 11   glipiZIDE (GLUCOTROL) 5 MG tablet Take 5 mg by mouth 2 (two) times daily before a meal.     metFORMIN (GLUCOPHAGE) 1000 MG tablet Take 1,000 mg by mouth 2 (two) times daily with a meal.     metoprolol tartrate (LOPRESSOR) 25 MG tablet Take 1 tablet (25 mg total) by mouth 2 (two) times daily. 60 tablet 2   No current facility-administered medications for this visit.    Physical Exam: BP 118/73   Pulse 68   Resp 20   Ht 6\' 1"  (1.854 m)   Wt 218 lb (98.9 kg)   SpO2 100% Comment: RA  BMI 28.76 kg/m  He looks well. Cardiac exam shows a regular rate and rhythm with normal heart sounds. Lungs are clear. The chest incision is healing well and the sternum is stable. There is no peripheral edema.  Diagnostic Tests:  Narrative & Impression  CLINICAL DATA:  Status post coronary artery bypass grafting.   EXAM: CHEST - 2 VIEW   COMPARISON:  Dec 01, 2020   FINDINGS: The lungs  are clear. No pneumothorax. The heart size and pulmonary vascularity are normal. Adenopathy. Evidence of prior coronary artery bypass grafting. No bone lesions.   IMPRESSION: Lungs clear. Heart size normal. Postoperative changes. No pneumothorax.     Electronically Signed   By: III M.D.   On: 01/07/2021 12:17      Impression:  He is doing well following his bypass surgery.  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  I encouraged him to continue increasing his activity level.  He is planning on participating in cardiac rehab.  Plan:  He will continue to follow-up with cardiology and will return to see me if he has any problems with his incisions.   Bretta Bang, MD [facility] [phone]

## 2021-01-08 ENCOUNTER — Telehealth: Payer: Self-pay

## 2021-01-08 NOTE — Telephone Encounter (Signed)
FMLA form completed and faxed to [phone] Beginning leave 11/29/20 through 02/23/21. Patient will pick up form at front desk

## 2021-01-21 ENCOUNTER — Telehealth (HOSPITAL_COMMUNITY): Payer: Self-pay

## 2021-01-21 NOTE — Telephone Encounter (Signed)
Outside/paper referral received by the [facility] for CR. Per appt desk pt is schedule with [facility] on 8/22. Will disregard CR for [facility].

## 2021-03-02 ENCOUNTER — Other Ambulatory Visit: Payer: Self-pay

## 2021-03-02 ENCOUNTER — Encounter (HOSPITAL_COMMUNITY)
Admission: RE | Admit: 2021-03-02 | Discharge: 2021-03-02 | Disposition: A | Discharge status: Home / Self Care | Payer: 59 | Source: Ambulatory Visit | Attending: Cardiovascular Disease | Admitting: Cardiovascular Disease

## 2021-03-02 VITALS — BP 124/56 | HR 82 | Ht 73.0 in | Wt 233.0 lb

## 2021-03-02 DIAGNOSIS — I2102 ST elevation (STEMI) myocardial infarction involving left anterior descending coronary artery: Secondary | ICD-10-CM | POA: Insufficient documentation

## 2021-03-02 DIAGNOSIS — Z951 Presence of aortocoronary bypass graft: Secondary | ICD-10-CM | POA: Diagnosis present

## 2021-03-02 LAB — GLUCOSE, CAPILLARY: Glucose-Capillary: 137 mg/dL — ABNORMAL HIGH (ref 70–99)

## 2021-03-02 NOTE — Progress Notes (Signed)
Cardiac/Pulmonary Rehab Medication Review by a Pharmacist  Does the patient  feel that his/her medications are working for him/her?  yes  Has the patient been experiencing any side effects to the medications prescribed?  no  Does the patient measure his/her own blood pressure or blood glucose at home?  yes   Does the patient have any problems obtaining medications due to transportation or finances?   no  Understanding of regimen: good Understanding of indications: good Potential of compliance: good  Questions asked to Determine Patient Understanding of Medication Regimen:  1. What is the name of the medication?  2. What is the medication used for?  3. When should it be taken?  4. How much should be taken?  5. How will you take it?  6. What side effects should you report?  Understanding Defined as: Excellent: All questions above are correct Good: Questions 1-4 are correct Fair: Questions 1-2 are correct  Poor: 1 or none of the above questions are correct   Pharmacist comments: Patient presents today for cardiac rehab.He is s/p CABG in May 2022. We reviewed his medications and he is tolerating without any problems except concern with ED. Metoprolol may be contributing. Suggested contact cardiologists or [facility] doctor for guidance and prescription to manage ED. He monitors his BP and BS daily. Hemoglobin A1C he said was 6.7. No other issues identified. Continue current regimen.  Thanks for the opportunity to participate in the care of this patient,  Allen Walton, BS Allen Walton, BCPS Clinical Pharmacist Pager [phone]  Tera Mater 03/02/2021 1:47 PM

## 2021-03-02 NOTE — Progress Notes (Signed)
Cardiac Individual Treatment Plan  Patient Details  Name: NIV DARLEY MRN: [id number] Date of Birth: [birth year] Referring Provider:   Flowsheet Row CARDIAC REHAB PHASE II ORIENTATION from 03/02/2021 in [facility] CARDIAC REHABILITATION  Referring Provider Dr. Laneta Simmers       Initial Encounter Date:  Flowsheet Row CARDIAC REHAB PHASE II ORIENTATION from 03/02/2021 in [place] CARDIAC REHABILITATION  Date 03/02/21       Visit Diagnosis: S/P CABG x 1  ST elevation myocardial infarction involving left anterior descending (LAD) coronary artery (HCC)  Patient's Home Medications on Admission:  Current Outpatient Medications:    amiodarone (PACERONE) 200 MG tablet, Take 2 tablets (400 mg total) by mouth 2 (two) times daily for 5 days then reduce the dose to 1 tablet (200mg ) by mouth twice daily., Disp: 60 tablet, Rfl: 1   aspirin 81 MG EC tablet, Take 1 tablet (81 mg total) by mouth daily. Swallow whole., Disp: 30 tablet, Rfl: 11   atorvastatin (LIPITOR) 80 MG tablet, Take 1 tablet (80 mg total) by mouth daily., Disp: 30 tablet, Rfl: 2   clopidogrel (PLAVIX) 75 MG tablet, Take 1 tablet (75 mg total) by mouth daily., Disp: 30 tablet, Rfl: 11   glipiZIDE (GLUCOTROL) 5 MG tablet, Take 2.5 mg by mouth 2 (two) times daily before a meal., Disp: , Rfl:    metFORMIN (GLUCOPHAGE) 1000 MG tablet, Take 1,000 mg by mouth 2 (two) times daily with a meal., Disp: , Rfl:    metoprolol tartrate (LOPRESSOR) 25 MG tablet, Take 1 tablet (25 mg total) by mouth 2 (two) times daily., Disp: 60 tablet, Rfl: 2   TechLite Lancets MISC, Inject 1 applicator into the skin daily as needed., Disp: , Rfl:   Past Medical History: Past Medical History:  Diagnosis Date   Anginal pain (HCC)    Diabetes mellitus without complication (HCC)    Myocardial infarction (HCC)     Tobacco Use: Social History   Tobacco Use  Smoking Status Some Days   Types: Cigarettes  Smokeless Tobacco Never    Labs: Recent Review  Flowsheet Data     Labs for ITP Cardiac and Pulmonary Rehab Latest Ref Rng & Units 11/29/2020 11/29/2020 11/29/2020 11/30/2020 12/02/2020   Cholestrol 0 - 200 mg/dL - - - - 12/04/2020   LDLCALC 0 - 99 mg/dL - - - - 63   HDL 629 mg/dL - - - - >52)   Trlycerides <150 mg/dL - - - - 84(X   Hemoglobin A1c 4.8 - 5.6 % - - - - -   PHART 7.350 - 7.450 7.328(L) 7.331(L) 7.328(L) 7.309(L) -   PCO2ART 32.0 - 48.0 mmHg 46.0 49.7(H) 45.0 45.6 -   HCO3 20.0 - 28.0 mmol/L 24.2 26.4 23.6 22.8 -   TCO2 22 - 32 mmol/L 26 28 25 24  -   ACIDBASEDEF 0.0 - 2.0 mmol/L 2.0 - 2.0 3.0(H) -   O2SAT % 94.0 99.0 99.0 97.0 -       Capillary Blood Glucose: Lab Results  Component Value Date   GLUCAP 137 (H) 03/02/2021   GLUCAP 130 (H) 12/04/2020   GLUCAP 137 (H) 12/03/2020   GLUCAP 139 (H) 12/03/2020   GLUCAP 102 (H) 12/03/2020     Exercise Target Goals: Exercise Program Goal: Individual exercise prescription set using results from initial 6 min walk test and THRR while considering  patient's activity barriers and safety.   Exercise Prescription Goal: Starting with aerobic activity 30 plus minutes a day, 3 days per  week for initial exercise prescription. Provide home exercise prescription and guidelines that participant acknowledges understanding prior to discharge.  Activity Barriers & Risk Stratification:  Activity Barriers & Cardiac Risk Stratification - 03/02/21 1251       Activity Barriers & Cardiac Risk Stratification   Activity Barriers Arthritis    Cardiac Risk Stratification High             6 Minute Walk:  6 Minute Walk     Row Name 03/02/21 1410         6 Minute Walk   Phase Initial     Distance 1900 feet     Walk Time 6 minutes     # of Rest Breaks 0     MPH 3.6     METS 4.91     RPE 11     VO2 Peak 17.17     Symptoms No     Resting HR 82 bpm     Resting BP 124/56     Resting Oxygen Saturation  98 %     Exercise Oxygen Saturation  during 6 min walk 100 %     Max Ex. HR 103 bpm      Max Ex. BP 134/50     2 Minute Post BP 112/54              Oxygen Initial Assessment:   Oxygen Re-Evaluation:   Oxygen Discharge (Final Oxygen Re-Evaluation):   Initial Exercise Prescription:  Initial Exercise Prescription - 03/02/21 1400       Date of Initial Exercise RX and Referring Provider   Date 03/02/21    Referring Provider Dr. Laneta Simmers    Expected Discharge Date 05/25/21      Treadmill   MPH 3    Grade 0    Minutes 17      Recumbant Elliptical   Level 1    RPM 60    Minutes 22      Prescription Details   Frequency (times per week) 3    Duration Progress to 30 minutes of continuous aerobic without signs/symptoms of physical distress      Intensity   THRR 40-80% of Max Heartrate 68-135    Ratings of Perceived Exertion 11-13    Perceived Dyspnea 0-4      Resistance Training   Training Prescription Yes    Weight 5 lbs    Reps 10-15             Perform Capillary Blood Glucose checks as needed.  Exercise Prescription Changes:   Exercise Comments:   Exercise Goals and Review:   Exercise Goals     Row Name 03/02/21 1413             Exercise Goals   Increase Physical Activity Yes       Intervention Provide advice, education, support and counseling about physical activity/exercise needs.;Develop an individualized exercise prescription for aerobic and resistive training based on initial evaluation findings, risk stratification, comorbidities and participant's personal goals.       Expected Outcomes Short Term: Attend rehab on a regular basis to increase amount of physical activity.;Long Term: Add in home exercise to make exercise part of routine and to increase amount of physical activity.;Long Term: Exercising regularly at least 3-5 days a week.       Increase Strength and Stamina Yes       Intervention Provide advice, education, support and counseling about physical activity/exercise needs.;Develop an individualized exercise  prescription for  aerobic and resistive training based on initial evaluation findings, risk stratification, comorbidities and participant's personal goals.       Expected Outcomes Short Term: Increase workloads from initial exercise prescription for resistance, speed, and METs.;Short Term: Perform resistance training exercises routinely during rehab and add in resistance training at home;Long Term: Improve cardiorespiratory fitness, muscular endurance and strength as measured by increased METs and functional capacity ( )       Able to understand and use rate of perceived exertion (RPE) scale Yes       Intervention Provide education and explanation on how to use RPE scale       Expected Outcomes Short Term: Able to use RPE daily in rehab to express subjective intensity level;Long Term:  Able to use RPE to guide intensity level when exercising independently       Knowledge and understanding of Target Heart Rate Range (THRR) Yes       Intervention Provide education and explanation of THRR including how the numbers were predicted and where they are located for reference       Expected Outcomes Short Term: Able to state/look up THRR;Long Term: Able to use THRR to govern intensity when exercising independently;Short Term: Able to use daily as guideline for intensity in rehab       Able to check pulse independently Yes       Intervention Provide education and demonstration on how to check pulse in carotid and radial arteries.;Review the importance of being able to check your own pulse for safety during independent exercise       Expected Outcomes Short Term: Able to explain why pulse checking is important during independent exercise;Long Term: Able to check pulse independently and accurately       Understanding of Exercise Prescription Yes       Intervention Provide education, explanation, and written materials on patient's individual exercise prescription       Expected Outcomes Short Term: Able to explain  program exercise prescription;Long Term: Able to explain home exercise prescription to exercise independently                Exercise Goals Re-Evaluation :    Discharge Exercise Prescription (Final Exercise Prescription Changes):   Nutrition:  Target Goals: Understanding of nutrition guidelines, daily intake of sodium 1500mg , cholesterol 200mg , calories 30% from fat and 7% or less from saturated fats, daily to have 5 or more servings of fruits and vegetables.  Biometrics:  Pre Biometrics - 03/02/21 1414       Pre Biometrics   Height 6\' 1"  (1.854 m)    Weight 233 lb 0.4 oz (105.7 kg)    Waist Circumference 41.5 inches    Hip Circumference 44 inches    Waist to Hip Ratio 0.94 %    BMI (Calculated) 30.75    Triceps Skinfold 10 mm    % Body Fat 26.6 %    Grip Strength 37.6 kg    Flexibility 0 in    Single Leg Stand 46 seconds              Nutrition Therapy Plan and Nutrition Goals:   Nutrition Assessments:  Nutrition Assessments - 03/02/21 1254       MEDFICTS Scores   Pre Score 3            MEDIFICTS Score Key: ?70 Need to make dietary changes  40-70 Heart Healthy Diet ? 40 Therapeutic Level Cholesterol Diet   Picture Your Plate Scores: 03/04/21 Unhealthy dietary pattern  with much room for improvement. 41-50 Dietary pattern unlikely to meet recommendations for good health and room for improvement. 51-60 More healthful dietary pattern, with some room for improvement.  >60 Healthy dietary pattern, although there may be some specific behaviors that could be improved.    Nutrition Goals Re-Evaluation:   Nutrition Goals Discharge (Final Nutrition Goals Re-Evaluation):   Psychosocial: Target Goals: Acknowledge presence or absence of significant depression and/or stress, maximize coping skills, provide positive support system. Participant is able to verbalize types and ability to use techniques and skills needed for reducing stress and  depression.  Initial Review & Psychosocial Screening:  Initial Psych Review & Screening - 03/02/21 1253       Initial Review   Current issues with Current Sleep Concerns      Family Dynamics   Good Support System? Yes    Comments His main support system is his wife.      Barriers   Psychosocial barriers to participate in program There are no identifiable barriers or psychosocial needs.;The patient should benefit from training in stress management and relaxation.      Screening Interventions   Interventions Encouraged to exercise    Expected Outcomes Long Term goal: The participant improves quality of Life and PHQ9 Scores as seen by post scores and/or verbalization of changes;Short Term goal: Identification and review with participant of any Quality of Life or Depression concerns found by scoring the questionnaire.             Quality of Life Scores:  Scores of 19 and below usually indicate a poorer quality of life in these areas.  A difference of  2-3 points is a clinically meaningful difference.  A difference of 2-3 points in the total score of the Quality of Life Index has been associated with significant improvement in overall quality of life, self-image, physical symptoms, and general health in studies assessing change in quality of life.  PHQ-9: Recent Review Flowsheet Data     Depression screen [facility] 2/[facility] 03/02/2021   Decreased Interest 1   Down, Depressed, Hopeless 1   PHQ - 2 Score 2   Altered sleeping 1   Tired, decreased energy 0   Change in appetite 1   Feeling bad or failure about yourself  0   Trouble concentrating 1   Moving slowly or fidgety/restless 0   Suicidal thoughts 0   PHQ-9 Score 5   Difficult doing work/chores Somewhat difficult      Interpretation of Total Score  Total Score Depression Severity:  1-4 = Minimal depression, 5-9 = Mild depression, 10-14 = Moderate depression, 15-19 = Moderately severe depression, 20-27 = Severe depression    Psychosocial Evaluation and Intervention:  Psychosocial Evaluation - 03/02/21 1414       Psychosocial Evaluation & Interventions   Interventions Stress management education;Relaxation education;Encouraged to exercise with the program and follow exercise prescription    Comments Pt's only barrier to completing rehab could be work. He is going to do the program twice per week in order to reduce the amount of time that he misses at work. He will end up clocking into work an hour late on the days he comes to rehab, but he states that his employer will let him stay an hour later to make up that hour. Pt does report that he does not get enough sleep. 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 Other than that, he has no identifiable  psychosocial issues. He scored a 5 on his PHQ-9 and reports that he copes well on his own. His main support system is his wife. His goal for the program is to reduce his SOB with activity.    Expected Outcomes Pt will continue to not have any identifiable psychosocial issues.    Continue Psychosocial Services  No Follow up required             Psychosocial Re-Evaluation:   Psychosocial Discharge (Final Psychosocial Re-Evaluation):   Vocational Rehabilitation: Provide vocational rehab assistance to qualifying candidates.   Vocational Rehab Evaluation & Intervention:  Vocational Rehab - 03/02/21 1300       Initial Vocational Rehab Evaluation & Intervention   Assessment shows need for Vocational Rehabilitation No             Education: Education Goals: Education classes will be provided on a weekly basis, covering required topics. Participant will state understanding/return demonstration of topics presented.  Learning Barriers/Preferences:  Learning Barriers/Preferences - 03/02/21 1256       Learning Barriers/Preferences   Learning Barriers None    Learning Preferences Skilled Demonstration              Education Topics: Hypertension, Hypertension Reduction -Define heart disease and high blood pressure. Discus how high blood pressure affects the body and ways to reduce high blood pressure.   Exercise and Your Heart -Discuss why it is important to exercise, the FITT principles of exercise, normal and abnormal responses to exercise, and how to exercise safely.   Angina -Discuss definition of angina, causes of angina, treatment of angina, and how to decrease risk of having angina.   Cardiac Medications -Review what the following cardiac medications are used for, how they affect the body, and side effects that may occur when taking the medications.  Medications include Aspirin, Beta blockers, calcium channel blockers, ACE Inhibitors, angiotensin receptor blockers, diuretics, digoxin, and antihyperlipidemics.   Congestive Heart Failure -Discuss the definition of CHF, how to live with CHF, the signs and symptoms of CHF, and how keep track of weight and sodium intake.   Heart Disease and Intimacy -Discus the effect sexual activity has on the heart, how changes occur during intimacy as we age, and safety during sexual activity.   Smoking Cessation / COPD -Discuss different methods to quit smoking, the health benefits of quitting smoking, and the definition of COPD.   Nutrition I: Fats -Discuss the types of cholesterol, what cholesterol does to the heart, and how cholesterol levels can be controlled.   Nutrition II: Labels -Discuss the different components of food labels and how to read food label   Heart Parts/Heart Disease and PAD -Discuss the anatomy of the heart, the pathway of blood circulation through the heart, and these are affected by heart disease.   Stress I: Signs and Symptoms -Discuss the causes of stress, how stress may lead to anxiety and depression, and ways to limit stress.   Stress II: Relaxation -Discuss different types of relaxation techniques to limit  stress.   Warning Signs of Stroke / TIA -Discuss definition of a stroke, what the signs and symptoms are of a stroke, and how to identify when someone is having stroke.   Knowledge Questionnaire Score:  Knowledge Questionnaire Score - 03/02/21 1256       Knowledge Questionnaire Score   Pre Score 17/24             Core Components/Risk Factors/Patient Goals at Admission:  Personal Goals and Risk Factors  at Admission - 03/02/21 1300       Core Components/Risk Factors/Patient Goals on Admission    Weight Management Yes;Weight Loss    Intervention Weight Management: Develop a combined nutrition and exercise program designed to reach desired caloric intake, while maintaining appropriate intake of nutrient and fiber, sodium and fats, and appropriate energy expenditure required for the weight goal.;Weight Management: Provide education and appropriate resources to help participant work on and attain dietary goals.    Admit Weight 229 lb (103.9 kg)    Goal Weight: Short Term 220 lb (99.8 kg)    Expected Outcomes Short Term: Continue to assess and modify interventions until short term weight is achieved;Long Term: Adherence to nutrition and physical activity/exercise program aimed toward attainment of established weight goal;Weight Loss: Understanding of general recommendations for a balanced deficit meal plan, which promotes 1-2 lb weight loss per week and includes a negative energy balance of [phone] kcal/d;Understanding recommendations for meals to include 15-35% energy as protein, 25-35% energy from fat, 35-60% energy from carbohydrates, less than  of dietary cholesterol, 20-35 gm of total fiber daily;Understanding of distribution of calorie intake throughout the day with the consumption of 4-5 meals/snacks    Improve shortness of breath with ADL's Yes    Intervention Provide education, individualized exercise plan and daily activity instruction to help decrease symptoms of SOB with  activities of daily living.    Expected Outcomes Short Term: Improve cardiorespiratory fitness to achieve a reduction of symptoms when performing ADLs;Long Term: Be able to perform more ADLs without symptoms or delay the onset of symptoms    Diabetes Yes    Intervention Provide education about signs/symptoms and action to take for hypo/hyperglycemia.;Provide education about proper nutrition, including hydration, and aerobic/resistive exercise prescription along with prescribed medications to achieve blood glucose in normal ranges: Fasting glucose 65-99 mg/dL    Expected Outcomes Short Term: Participant verbalizes understanding of the signs/symptoms and immediate care of hyper/hypoglycemia, proper foot care and importance of medication, aerobic/resistive exercise and nutrition plan for blood glucose control.;Long Term: Attainment of HbA1C < 7%.             Core Components/Risk Factors/Patient Goals Review:    Core Components/Risk Factors/Patient Goals at Discharge (Final Review):    ITP Comments:   Comments: Patient arrived for 1st visit/orientation/education at 1230. Patient was referred to CR by Dr. Laneta Simmers due to STEMI (I21.02) and S/P CABG x1 (Z95.1). During orientation advised patient on arrival and appointment times what to wear, what to do before, during and after exercise. Reviewed attendance and class policy.  Pt is scheduled to return Cardiac Rehab on 03/06/21 at 0815. Pt was advised to come to class 15 minutes before class starts.  Discussed RPE/Dpysnea scales. Patient participated in warm up stretches. Patient was able to complete 6 minute walk test.  Telemetry: NSR. Patient was measured for the equipment. Discussed equipment safety with patient. Took patient pre-anthropometric measurements. Patient finished visit at 1400.

## 2021-03-03 DIAGNOSIS — Z951 Presence of aortocoronary bypass graft: Secondary | ICD-10-CM | POA: Diagnosis not present

## 2021-03-06 ENCOUNTER — Encounter (HOSPITAL_COMMUNITY)
Admission: RE | Admit: 2021-03-06 | Discharge: 2021-03-06 | Disposition: A | Discharge status: Home / Self Care | Payer: 59 | Source: Ambulatory Visit | Attending: Cardiovascular Disease | Admitting: Cardiovascular Disease

## 2021-03-06 ENCOUNTER — Other Ambulatory Visit: Payer: Self-pay

## 2021-03-06 DIAGNOSIS — Z951 Presence of aortocoronary bypass graft: Secondary | ICD-10-CM | POA: Diagnosis not present

## 2021-03-06 DIAGNOSIS — I2102 ST elevation (STEMI) myocardial infarction involving left anterior descending coronary artery: Secondary | ICD-10-CM

## 2021-03-06 LAB — GLUCOSE, CAPILLARY: Glucose-Capillary: 114 mg/dL — ABNORMAL HIGH (ref 70–99)

## 2021-03-06 NOTE — Progress Notes (Signed)
Daily Session Note  Patient Details  Name: Allen Walton MRN: [id number] Date of Birth: 10/24/1970 Referring Provider:   Flowsheet Row CARDIAC REHAB PHASE II ORIENTATION from 03/02/2021 in [place]  Referring Provider Dr. Cyndia Bent       Encounter Date: 03/06/2021  Check In:  Session Check In - 03/06/21 0815       Check-In   Supervising physician immediately available to respond to emergencies [facility] MD immediately available    Physician(s) Dr. Harl Bowie    Location [facility]    Staff Present Aundra Dubin, RN, BSN;Other    Virtual Visit No    Medication changes reported     No    Fall or balance concerns reported    No    Tobacco Cessation No Change    Warm-up and Cool-down Performed as group-led instruction    Resistance Training Performed No    VAD Patient? No    PAD/SET Patient? No      Pain Assessment   Currently in Pain? No/denies    Multiple Pain Sites No             Capillary Blood Glucose: No results found for this or any previous visit (from the past 24 hour(s)).    Social History   Tobacco Use  Smoking Status Some Days   Types: Cigarettes  Smokeless Tobacco Never    Goals Met:  Independence with exercise equipment Exercise tolerated well No report of concerns or symptoms today Strength training completed today  Goals Unmet:  Not Applicable  Comments: Check out 915.   Dr. Kathie Dike is Medical Director for [facility] Pulmonary Rehab.

## 2021-03-09 ENCOUNTER — Encounter (HOSPITAL_COMMUNITY)
Admission: RE | Admit: 2021-03-09 | Discharge: 2021-03-09 | Disposition: A | Discharge status: Home / Self Care | Payer: 59 | Source: Ambulatory Visit | Attending: Cardiovascular Disease | Admitting: Cardiovascular Disease

## 2021-03-09 ENCOUNTER — Other Ambulatory Visit: Payer: Self-pay

## 2021-03-09 VITALS — Wt 224.4 lb

## 2021-03-09 DIAGNOSIS — Z951 Presence of aortocoronary bypass graft: Secondary | ICD-10-CM

## 2021-03-09 DIAGNOSIS — I2102 ST elevation (STEMI) myocardial infarction involving left anterior descending coronary artery: Secondary | ICD-10-CM

## 2021-03-09 NOTE — Progress Notes (Signed)
Daily Session Note  Patient Details  Name: Allen Walton MRN: [id number] Date of Birth: May 20, 1971 Referring Provider:   Flowsheet Row CARDIAC REHAB PHASE II ORIENTATION from 03/02/2021 in [place]  Referring Provider Dr. Cyndia Bent       Encounter Date: 03/09/2021  Check In:  Session Check In - 03/09/21 0815       Check-In   Supervising physician immediately available to respond to emergencies [facility] MD immediately available    Physician(s) Dr. Domenic Polite    Location [facility]    Staff Present Geanie Cooley, RN;Debra Wynetta Emery, RN, BSN    Virtual Visit No    Medication changes reported     No    Fall or balance concerns reported    No    Tobacco Cessation No Change    Warm-up and Cool-down Performed as group-led instruction    Resistance Training Performed No    VAD Patient? No    PAD/SET Patient? No      Pain Assessment   Currently in Pain? No/denies    Multiple Pain Sites No             Capillary Blood Glucose: No results found for this or any previous visit (from the past 24 hour(s)).    Social History   Tobacco Use  Smoking Status Some Days   Types: Cigarettes  Smokeless Tobacco Never    Goals Met:  Independence with exercise equipment Exercise tolerated well No report of concerns or symptoms today Strength training completed today  Goals Unmet:  Not Applicable  Comments: check out @ 9:15am   Dr. Kathie Dike is Medical Director for [facility] Pulmonary Rehab.

## 2021-03-11 ENCOUNTER — Telehealth (HOSPITAL_COMMUNITY): Payer: Self-pay

## 2021-03-13 ENCOUNTER — Ambulatory Visit (INDEPENDENT_AMBULATORY_CARE_PROVIDER_SITE_OTHER): Payer: 59 | Admitting: Cardiovascular Disease

## 2021-03-13 ENCOUNTER — Encounter (HOSPITAL_COMMUNITY): Payer: 59

## 2021-03-13 ENCOUNTER — Encounter: Payer: Self-pay | Admitting: Cardiovascular Disease

## 2021-03-13 ENCOUNTER — Other Ambulatory Visit: Payer: Self-pay

## 2021-03-13 DIAGNOSIS — E785 Hyperlipidemia, unspecified: Secondary | ICD-10-CM | POA: Insufficient documentation

## 2021-03-13 DIAGNOSIS — E782 Mixed hyperlipidemia: Secondary | ICD-10-CM

## 2021-03-13 DIAGNOSIS — Z720 Tobacco use: Secondary | ICD-10-CM

## 2021-03-13 DIAGNOSIS — E119 Type 2 diabetes mellitus without complications: Secondary | ICD-10-CM | POA: Diagnosis not present

## 2021-03-13 DIAGNOSIS — E1165 Type 2 diabetes mellitus with hyperglycemia: Secondary | ICD-10-CM | POA: Insufficient documentation

## 2021-03-13 DIAGNOSIS — I2119 ST elevation (STEMI) myocardial infarction involving other coronary artery of inferior wall: Secondary | ICD-10-CM | POA: Diagnosis not present

## 2021-03-13 NOTE — Assessment & Plan Note (Signed)
History of hyperlipidemia on statin therapy with lipid profile performed 12/02/2020 revealing total cholesterol 107, LDL 63 and HDL of 16.

## 2021-03-13 NOTE — Assessment & Plan Note (Signed)
History of 35 pack years of tobacco abuse having quit at the time of his anterior STEMI.

## 2021-03-13 NOTE — Assessment & Plan Note (Signed)
History of CAD status post anterolateral STEMI 11/29/2020.  Performed cardiac catheterization on him urgently revealing ruptured plaque at the ostium of the LAD.  He had no other significant CAD.  I felt the best option was urgent bypass grafting which Dr. Laneta Simmers did placing a LIMA to his LAD.  He had an uncomplicated hospital course except for PAF which converted on amiodarone which she no longer takes.  He is participating cardiac rehab at [facility].  He denies chest pain or shortness of breath.  He did stop smoking at that time.

## 2021-03-13 NOTE — Progress Notes (Signed)
03/13/2021 Allen Walton   04-Jun-1971  [id number]  Primary Physician Pcp, No Primary Cardiologist: Lorretta Harp MD Lupe Carney, [place]  HPI:  Allen Walton is a 50 y.o. 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  I first met him on 11/29/2020 when he presented with an anterior STEMI.  He does have a history of discontinue tobacco use having smoked 35 years and quit at the time of his STEMI, and type 2 diabetes.  I brought him urgently to the Cath Lab where he was found to have a ruptured plaque of the ostium of his LAD felt not to be a candidate for percutaneous intervention.  He had urgent CABG x1 by Dr. Gilford Raid  with a LIMA to his LAD.  His postop course was uncomplicated other than PAF which converted to sinus rhythm on amiodarone which she has since discontinued.  He is participating in cardiac rehab at [facility].   Current Meds  Medication Sig   amiodarone (PACERONE) 200 MG tablet Take 2 tablets (400 mg total) by mouth 2 (two) times daily for 5 days then reduce the dose to 1 tablet (257m) by mouth twice daily.   aspirin 81 MG EC tablet Take 1 tablet (81 mg total) by mouth daily. Swallow whole.   atorvastatin (LIPITOR) 80 MG tablet Take 1 tablet (80 mg total) by mouth daily.   clopidogrel (PLAVIX) 75 MG tablet Take 1 tablet (75 mg total) by mouth daily.   glipiZIDE (GLUCOTROL) 5 MG tablet Take 2.5 mg by mouth 2 (two) times daily before a meal.   metFORMIN (GLUCOPHAGE) 1000 MG tablet Take 1,000 mg by mouth 2 (two) times daily with a meal.   metoprolol tartrate (LOPRESSOR) 25 MG tablet Take 1 tablet (25 mg total) by mouth 2 (two) times daily.   TechLite Lancets MISC Inject 1 applicator into the skin daily as needed.     No Known Allergies  Social History   Socioeconomic History   Marital status: Married    Spouse name: Not  on file   Number of children: Not on file   Years of education: Not on file   Highest education level: Not on file  Occupational History   Not on file  Tobacco Use   Smoking status: Some Days    Types: Cigarettes   Smokeless tobacco: Never  Substance and Sexual Activity   Alcohol use: Never   Drug use: Never   Sexual activity: Not on file  Other Topics Concern   Not on file  Social History Narrative   Not on file   Social Determinants of Health   Financial Resource Strain: Not on file  Food Insecurity: Not on file  Transportation Needs: Not on file  Physical Activity: Not on file  Stress: Not on file  Social Connections: Not on file  Intimate Partner Violence: Not on file     Review of Systems: General: negative for chills, fever, night sweats or weight changes.  Cardiovascular: negative for chest pain, dyspnea on exertion, edema, orthopnea, palpitations, paroxysmal nocturnal dyspnea or shortness of breath Dermatological: negative for rash Respiratory: negative for cough or wheezing Urologic: negative for hematuria Abdominal: negative for nausea, vomiting, diarrhea, bright red blood per rectum, melena, or hematemesis Neurologic: negative for visual changes, syncope, or dizziness All other systems reviewed and are otherwise negative  except as noted above.    Blood pressure 118/81, pulse 78, height _0  (1.854 m), weight 219 lb 9.6 oz (99.6 kg), SpO2 100 %.  General appearance: alert and no distress Neck: no adenopathy, no carotid bruit, no JVD, supple, symmetrical, trachea midline, and thyroid not enlarged, symmetric, no tenderness/mass/nodules Lungs: clear to auscultation bilaterally Heart: regular rate and rhythm, S1, S2 normal, no murmur, click, rub or gallop Extremities: extremities normal, atraumatic, no cyanosis or edema Pulses: 2+ and symmetric Skin: Skin color, texture, turgor normal. No rashes or lesions Neurologic: Grossly normal  EKG not performed  today  ASSESSMENT AND PLAN:   Acute ST elevation myocardial infarction (STEMI) of inferior wall (HCC) History of CAD status post anterolateral STEMI 11/29/2020.  Performed cardiac catheterization on him urgently revealing ruptured plaque at the ostium of the LAD.  He had no other significant CAD.  I felt the best option was urgent bypass grafting which Dr. Cyndia Bent did placing a LIMA to his LAD.  He had an uncomplicated hospital course except for PAF which converted on amiodarone which she no longer takes.  He is participating cardiac rehab at [facility].  He denies chest pain or shortness of breath.  He did stop smoking at that time.  Hyperlipidemia History of hyperlipidemia on statin therapy with lipid profile performed 12/02/2020 revealing total cholesterol 107, LDL 63 and HDL of 16.  Tobacco abuse History of 35 pack years of tobacco abuse having quit at the time of his anterior STEMI.     Lorretta Harp MD FACP,FACC,FAHA, [facility] 03/13/2021 8:17 AM

## 2021-03-13 NOTE — Patient Instructions (Signed)
Medication Instructions:  Your physician recommends that you continue on your current medications as directed. Please refer to the Current Medication list given to you today.  *If you need a refill on your cardiac medications before your next appointment, please call your pharmacy*  Follow-Up: At [facility], you and your health needs are our priority.  As part of our continuing mission to provide you with exceptional heart care, we have created designated Provider Care Teams.  These Care Teams include your primary Cardiologist (physician) and Advanced Practice Providers (APPs -  Physician Assistants and Nurse Practitioners) who all work together to provide you with the care you need, when you need it.  We recommend signing up for the patient portal called "MyChart".  Sign up information is provided on this After Visit Summary.  MyChart is used to connect with patients for Virtual Visits (Telemedicine).  Patients are able to view lab/test results, encounter notes, upcoming appointments, etc.  Non-urgent messages can be sent to your provider as well.   To learn more about what you can do with MyChart, go to [email or web address].    Your next appointment:   12 month(s)  The format for your next appointment:   In Person  Provider:   You may see Jonathan Berry, MD or one of the following Advanced Practice Providers on your designated Care Team:   Callie Goodrich, PA-C Jesse Cleaver, FNP   Other Instructions   

## 2021-03-16 ENCOUNTER — Encounter (HOSPITAL_COMMUNITY): Payer: 59

## 2021-03-20 ENCOUNTER — Other Ambulatory Visit: Payer: Self-pay

## 2021-03-20 ENCOUNTER — Encounter (HOSPITAL_COMMUNITY)
Admission: RE | Admit: 2021-03-20 | Discharge: 2021-03-20 | Disposition: A | Discharge status: Home / Self Care | Payer: 59 | Source: Ambulatory Visit | Attending: Cardiovascular Disease | Admitting: Cardiovascular Disease

## 2021-03-20 DIAGNOSIS — I2102 ST elevation (STEMI) myocardial infarction involving left anterior descending coronary artery: Secondary | ICD-10-CM | POA: Diagnosis not present

## 2021-03-20 DIAGNOSIS — Z951 Presence of aortocoronary bypass graft: Secondary | ICD-10-CM | POA: Insufficient documentation

## 2021-03-23 ENCOUNTER — Other Ambulatory Visit: Payer: Self-pay

## 2021-03-23 ENCOUNTER — Encounter (HOSPITAL_COMMUNITY)
Admission: RE | Admit: 2021-03-23 | Discharge: 2021-03-23 | Disposition: A | Discharge status: Home / Self Care | Payer: 59 | Source: Ambulatory Visit | Attending: Cardiovascular Disease | Admitting: Cardiovascular Disease

## 2021-03-23 VITALS — Wt 228.2 lb

## 2021-03-23 DIAGNOSIS — Z951 Presence of aortocoronary bypass graft: Secondary | ICD-10-CM

## 2021-03-23 DIAGNOSIS — I2102 ST elevation (STEMI) myocardial infarction involving left anterior descending coronary artery: Secondary | ICD-10-CM

## 2021-03-23 NOTE — Progress Notes (Signed)
Daily Session Note  Patient Details  Name: Allen Walton MRN: [id number] Date of Birth: [birth year] Referring Provider:   Flowsheet Row CARDIAC REHAB PHASE II ORIENTATION from 03/02/2021 in [place]  Referring Provider Dr. Cyndia Bent       Encounter Date: 03/23/2021  Check In:  Session Check In - 03/23/21 0815       Check-In   Supervising physician immediately available to respond to emergencies [facility] immediately available    Physician(s) Dr. Domenic Polite    Location [facility]    Staff Present Geanie Cooley, RN;Debra Wynetta Emery, RN, BSN    Virtual Visit No    Medication changes reported     No    Fall or balance concerns reported    No    Tobacco Cessation No Change    Warm-up and Cool-down Performed as group-led instruction    Resistance Training Performed Yes    VAD Patient? No    PAD/SET Patient? No      Pain Assessment   Currently in Pain? No/denies    Multiple Pain Sites No             Capillary Blood Glucose: No results found for this or any previous visit (from the past 24 hour(s)).    Social History   Tobacco Use  Smoking Status Some Days   Types: Cigarettes  Smokeless Tobacco Never    Goals Met:  Independence with exercise equipment Exercise tolerated well No report of concerns or symptoms today Strength training completed today  Goals Unmet:  Not Applicable  Comments: check out @ 9:15am   Dr. Kathie Dike is Medical Director for [facility].

## 2021-03-25 NOTE — Progress Notes (Signed)
Cardiac Individual Treatment Plan  Patient Details  Name: Allen Walton MRN: [id number] Date of Birth: 11/05/1970 Referring Provider:   Flowsheet Row CARDIAC REHAB PHASE II ORIENTATION from 03/02/2021 in [facility]  Referring Provider Dr. Cyndia Bent       Initial Encounter Date:  Flowsheet Row CARDIAC REHAB PHASE II ORIENTATION from 03/02/2021 in [facility]  Date 03/02/21       Visit Diagnosis: ST elevation myocardial infarction involving left anterior descending (LAD) coronary artery (HCC)  S/P CABG x 1  Patient's Home Medications on Admission:  Current Outpatient Medications:    amiodarone (PACERONE) 200 MG tablet, Take 2 tablets (400 mg total) by mouth 2 (two) times daily for 5 days then reduce the dose to 1 tablet (200mg ) by mouth twice daily., Disp: 60 tablet, Rfl: 1   aspirin 81 MG EC tablet, Take 1 tablet (81 mg total) by mouth daily. Swallow whole., Disp: 30 tablet, Rfl: 11   atorvastatin (LIPITOR) 80 MG tablet, Take 1 tablet (80 mg total) by mouth daily., Disp: 30 tablet, Rfl: 2   clopidogrel (PLAVIX) 75 MG tablet, Take 1 tablet (75 mg total) by mouth daily., Disp: 30 tablet, Rfl: 11   glipiZIDE (GLUCOTROL) 5 MG tablet, Take 2.5 mg by mouth 2 (two) times daily before a meal., Disp: , Rfl:    metFORMIN (GLUCOPHAGE) 1000 MG tablet, Take 1,000 mg by mouth 2 (two) times daily with a meal., Disp: , Rfl:    metoprolol tartrate (LOPRESSOR) 25 MG tablet, Take 1 tablet (25 mg total) by mouth 2 (two) times daily., Disp: 60 tablet, Rfl: 2   TechLite Lancets MISC, Inject 1 applicator into the skin daily as needed., Disp: , Rfl:   Past Medical History: Past Medical History:  Diagnosis Date   Anginal pain ([place])    Diabetes mellitus without complication ([place])    Myocardial infarction ([place])     Tobacco Use: Social History   Tobacco Use  Smoking Status Some Days   Types: Cigarettes  Smokeless Tobacco Never    Labs: Recent Review  Flowsheet Data     Labs for ITP Cardiac and Pulmonary Rehab Latest Ref Rng & Units 11/29/2020 11/29/2020 11/29/2020 11/30/2020 12/02/2020   Cholestrol 0 - 200 mg/dL - - - - 107   LDLCALC 0 - 99 mg/dL - - - - 63   HDL >40 mg/dL - - - - 16(L)   Trlycerides <150 mg/dL - - - - 139   Hemoglobin A1c 4.8 - 5.6 % - - - - -   PHART 7.350 - 7.450 7.328(L) 7.331(L) 7.328(L) 7.309(L) -   PCO2ART 32.0 - 48.0 mmHg 46.0 49.7(H) 45.0 45.6 -   HCO3 20.0 - 28.0 mmol/L 24.2 26.4 23.6 22.8 -   TCO2 22 - 32 mmol/L 26 28 25 24  -   ACIDBASEDEF 0.0 - 2.0 mmol/L 2.0 - 2.0 3.0(H) -   O2SAT % 94.0 99.0 99.0 97.0 -       Capillary Blood Glucose: Lab Results  Component Value Date   GLUCAP 114 (H) 03/06/2021   GLUCAP 137 (H) 03/02/2021   GLUCAP 130 (H) 12/04/2020   GLUCAP 137 (H) 12/03/2020   GLUCAP 139 (H) 12/03/2020     Exercise Target Goals: Exercise Program Goal: Individual exercise prescription set using results from initial 6 min walk test and THRR while considering  patient's activity barriers and safety.   Exercise Prescription Goal: Starting with aerobic activity 30 plus minutes a day, 3 days per  week for initial exercise prescription. Provide home exercise prescription and guidelines that participant acknowledges understanding prior to discharge.  Activity Barriers & Risk Stratification:  Activity Barriers & Cardiac Risk Stratification - 03/02/21 1251       Activity Barriers & Cardiac Risk Stratification   Activity Barriers Arthritis    Cardiac Risk Stratification High             6 Minute Walk:  6 Minute Walk     Row Name 03/02/21 1410         6 Minute Walk   Phase Initial     Distance 1900 feet     Walk Time 6 minutes     # of Rest Breaks 0     MPH 3.6     METS 4.91     RPE 11     VO2 Peak 17.17     Symptoms No     Resting HR 82 bpm     Resting BP 124/56     Resting Oxygen Saturation  98 %     Exercise Oxygen Saturation  during 6 min walk 100 %     Max Ex. HR 103 bpm      Max Ex. BP 134/50     2 Minute Post BP 112/54              Oxygen Initial Assessment:   Oxygen Re-Evaluation:   Oxygen Discharge (Final Oxygen Re-Evaluation):   Initial Exercise Prescription:  Initial Exercise Prescription - 03/02/21 1400       Date of Initial Exercise RX and Referring Provider   Date 03/02/21    Referring Provider Dr. Laneta Simmers    Expected Discharge Date 05/25/21      Treadmill   MPH 3    Grade 0    Minutes 17      Recumbant Elliptical   Level 1    RPM 60    Minutes 22      Prescription Details   Frequency (times per week) 3    Duration Progress to 30 minutes of continuous aerobic without signs/symptoms of physical distress      Intensity   THRR 40-80% of Max Heartrate 68-135    Ratings of Perceived Exertion 11-13    Perceived Dyspnea 0-4      Resistance Training   Training Prescription Yes    Weight 5 lbs    Reps 10-15             Perform Capillary Blood Glucose checks as needed.  Exercise Prescription Changes:   Exercise Prescription Changes     Row Name 03/09/21 0815 03/23/21 0815           Response to Exercise   Blood Pressure (Admit) 120/58 112/60      Blood Pressure (Exercise) 138/68 132/60      Blood Pressure (Exit) 110/58 108/58      Heart Rate (Admit) 70 bpm 76 bpm      Heart Rate (Exercise) 90 bpm 105 bpm      Heart Rate (Exit) 73 bpm 84 bpm      Rating of Perceived Exertion (Exercise) 12 12      Duration Continue with 30 min of aerobic exercise without signs/symptoms of physical distress. Continue with 30 min of aerobic exercise without signs/symptoms of physical distress.      Intensity THRR unchanged THRR unchanged             Progression   Progression Continue to progress workloads  to maintain intensity without signs/symptoms of physical distress. Continue to progress workloads to maintain intensity without signs/symptoms of physical distress.             Resistance Training   Training Prescription  Yes Yes      Weight 5 lbs 5 lbs      Reps 10-15 10-15      Time 10 Minutes 10 Minutes             Treadmill   MPH 3 3.1      Grade 0 0      Minutes 17 17      METs 3.3 3.37             Recumbant Elliptical   Level 1 3      RPM 56 58      Minutes 22 22      METs 3.9 3               Exercise Comments:   Exercise Goals and Review:   Exercise Goals     Row Name 03/02/21 1413 03/24/21 0910           Exercise Goals   Increase Physical Activity Yes Yes      Intervention Provide advice, education, support and counseling about physical activity/exercise needs.;Develop an individualized exercise prescription for aerobic and resistive training based on initial evaluation findings, risk stratification, comorbidities and participant's personal goals. Provide advice, education, support and counseling about physical activity/exercise needs.;Develop an individualized exercise prescription for aerobic and resistive training based on initial evaluation findings, risk stratification, comorbidities and participant's personal goals.      Expected Outcomes Short Term: Attend rehab on a regular basis to increase amount of physical activity.;Long Term: Add in home exercise to make exercise part of routine and to increase amount of physical activity.;Long Term: Exercising regularly at least 3-5 days a week. Short Term: Attend rehab on a regular basis to increase amount of physical activity.;Long Term: Add in home exercise to make exercise part of routine and to increase amount of physical activity.;Long Term: Exercising regularly at least 3-5 days a week.      Increase Strength and Stamina Yes Yes      Intervention Provide advice, education, support and counseling about physical activity/exercise needs.;Develop an individualized exercise prescription for aerobic and resistive training based on initial evaluation findings, risk stratification, comorbidities and participant's personal goals. Provide  advice, education, support and counseling about physical activity/exercise needs.;Develop an individualized exercise prescription for aerobic and resistive training based on initial evaluation findings, risk stratification, comorbidities and participant's personal goals.      Expected Outcomes Short Term: Increase workloads from initial exercise prescription for resistance, speed, and METs.;Short Term: Perform resistance training exercises routinely during rehab and add in resistance training at home;Long Term: Improve cardiorespiratory fitness, muscular endurance and strength as measured by increased METs and functional capacity ( ) Short Term: Increase workloads from initial exercise prescription for resistance, speed, and METs.;Short Term: Perform resistance training exercises routinely during rehab and add in resistance training at home;Long Term: Improve cardiorespiratory fitness, muscular endurance and strength as measured by increased METs and functional capacity ( )      Able to understand and use rate of perceived exertion (RPE) scale Yes Yes      Intervention Provide education and explanation on how to use RPE scale Provide education and explanation on how to use RPE scale      Expected Outcomes Short Term: Able to use RPE  daily in rehab to express subjective intensity level;Long Term:  Able to use RPE to guide intensity level when exercising independently Short Term: Able to use RPE daily in rehab to express subjective intensity level;Long Term:  Able to use RPE to guide intensity level when exercising independently      Knowledge and understanding of Target Heart Rate Range (THRR) Yes Yes      Intervention Provide education and explanation of THRR including how the numbers were predicted and where they are located for reference Provide education and explanation of THRR including how the numbers were predicted and where they are located for reference      Expected Outcomes Short Term: Able to  state/look up THRR;Long Term: Able to use THRR to govern intensity when exercising independently;Short Term: Able to use daily as guideline for intensity in rehab Short Term: Able to state/look up THRR;Long Term: Able to use THRR to govern intensity when exercising independently;Short Term: Able to use daily as guideline for intensity in rehab      Able to check pulse independently Yes Yes      Intervention Provide education and demonstration on how to check pulse in carotid and radial arteries.;Review the importance of being able to check your own pulse for safety during independent exercise Provide education and demonstration on how to check pulse in carotid and radial arteries.;Review the importance of being able to check your own pulse for safety during independent exercise      Expected Outcomes Short Term: Able to explain why pulse checking is important during independent exercise;Long Term: Able to check pulse independently and accurately Short Term: Able to explain why pulse checking is important during independent exercise;Long Term: Able to check pulse independently and accurately      Understanding of Exercise Prescription Yes Yes      Intervention Provide education, explanation, and written materials on patient's individual exercise prescription Provide education, explanation, and written materials on patient's individual exercise prescription      Expected Outcomes Short Term: Able to explain program exercise prescription;Long Term: Able to explain home exercise prescription to exercise independently Short Term: Able to explain program exercise prescription;Long Term: Able to explain home exercise prescription to exercise independently               Exercise Goals Re-Evaluation :  Exercise Goals Re-Evaluation     Row Name 03/24/21 0910             Exercise Goal Re-Evaluation   Exercise Goals Review Increase Physical Activity;Increase Strength and Stamina;Able to understand and  use rate of perceived exertion (RPE) scale;Knowledge and understanding of Target Heart Rate Range (THRR);Able to check pulse independently;Understanding of Exercise Prescription       Comments Pt has completed 5 sessions of cardiac rehab. He missed several sessions due to having COVID but has since returned with no problems. He was fit at his baseline and should progress well in the program. He is currently exercising at 3.0 METs on the elliptical. Will continue to monitor and progress as able.       Expected Outcomes Through exercise at rehab and at home, the patient will meet their stated goals.                 Discharge Exercise Prescription (Final Exercise Prescription Changes):  Exercise Prescription Changes - 03/23/21 0815       Response to Exercise   Blood Pressure (Admit) 112/60    Blood Pressure (Exercise) 132/60  Blood Pressure (Exit) 108/58    Heart Rate (Admit) 76 bpm    Heart Rate (Exercise) 105 bpm    Heart Rate (Exit) 84 bpm    Rating of Perceived Exertion (Exercise) 12    Duration Continue with 30 min of aerobic exercise without signs/symptoms of physical distress.    Intensity THRR unchanged      Progression   Progression Continue to progress workloads to maintain intensity without signs/symptoms of physical distress.      Resistance Training   Training Prescription Yes    Weight 5 lbs    Reps 10-15    Time 10 Minutes      Treadmill   MPH 3.1    Grade 0    Minutes 17    METs 3.37      Recumbant Elliptical   Level 3    RPM 58    Minutes 22    METs 3             Nutrition:  Target Goals: Understanding of nutrition guidelines, daily intake of sodium 1500mg , cholesterol 200mg , calories 30% from fat and 7% or less from saturated fats, daily to have 5 or more servings of fruits and vegetables.  Biometrics:  Pre Biometrics - 03/02/21 1414       Pre Biometrics   Height 6\' 1"  (1.854 m)    Weight 105.7 kg    Waist Circumference 41.5 inches     Hip Circumference 44 inches    Waist to Hip Ratio 0.94 %    BMI (Calculated) 30.75    Triceps Skinfold 10 mm    % Body Fat 26.6 %    Grip Strength 37.6 kg    Flexibility 0 in    Single Leg Stand 46 seconds              Nutrition Therapy Plan and Nutrition Goals:   Nutrition Assessments:  Nutrition Assessments - 03/02/21 1254       MEDFICTS Scores   Pre Score 3            MEDIFICTS Score Key: ?70 Need to make dietary changes  40-70 Heart Healthy Diet ? 40 Therapeutic Level Cholesterol Diet   Picture Your Plate Scores: 03/04/21 Unhealthy dietary pattern with much room for improvement. 41-50 Dietary pattern unlikely to meet recommendations for good health and room for improvement. 51-60 More healthful dietary pattern, with some room for improvement.  >60 Healthy dietary pattern, although there may be some specific behaviors that could be improved.    Nutrition Goals Re-Evaluation:   Nutrition Goals Discharge (Final Nutrition Goals Re-Evaluation):   Psychosocial: Target Goals: Acknowledge presence or absence of significant depression and/or stress, maximize coping skills, provide positive support system. Participant is able to verbalize types and ability to use techniques and skills needed for reducing stress and depression.  Initial Review & Psychosocial Screening:  Initial Psych Review & Screening - 03/02/21 1253       Initial Review   Current issues with Current Sleep Concerns      Family Dynamics   Good Support System? Yes    Comments His main support system is his wife.      Barriers   Psychosocial barriers to participate in program There are no identifiable barriers or psychosocial needs.;The patient should benefit from training in stress management and relaxation.      Screening Interventions   Interventions Encouraged to exercise    Expected Outcomes Long Term goal: The participant improves quality of  Life and PHQ9 Scores as seen by post scores  and/or verbalization of changes;Short Term goal: Identification and review with participant of any Quality of Life or Depression concerns found by scoring the questionnaire.             Quality of Life Scores:  Quality of Life - 03/02/21 1458       Quality of Life   Select Quality of Life      Quality of Life Scores   Health/Function Pre 21.2 %    Socioeconomic Pre 22.79 %    Psych/Spiritual Pre 25.29 %    Family Pre 24.6 %    GLOBAL Pre 22.87 %            Scores of 19 and below usually indicate a poorer quality of life in these areas.  A difference of  2-3 points is a clinically meaningful difference.  A difference of 2-3 points in the total score of the Quality of Life Index has been associated with significant improvement in overall quality of life, self-image, physical symptoms, and general health in studies assessing change in quality of life.  PHQ-9: Recent Review Flowsheet Data     Depression screen [facility] 2/9 03/02/2021   Decreased Interest 1   Down, Depressed, Hopeless 1   PHQ - 2 Score 2   Altered sleeping 1   Tired, decreased energy 0   Change in appetite 1   Feeling bad or failure about yourself  0   Trouble concentrating 1   Moving slowly or fidgety/restless 0   Suicidal thoughts 0   PHQ-9 Score 5   Difficult doing work/chores Somewhat difficult      Interpretation of Total Score  Total Score Depression Severity:  1-4 = Minimal depression, 5-9 = Mild depression, 10-14 = Moderate depression, 15-19 = Moderately severe depression, 20-27 = Severe depression   Psychosocial Evaluation and Intervention:  Psychosocial Evaluation - 03/02/21 1414       Psychosocial Evaluation & Interventions   Interventions Stress management education;Relaxation education;Encouraged to exercise with the program and follow exercise prescription    Comments Pt's only barrier to completing rehab could be work. He is going to do the program twice per week in order to reduce the  amount of time that he misses at work. He will end up clocking into work an hour late on the days he comes to rehab, but he states that his employer will let him stay an hour later to make up that hour. Pt does report that he does not get enough sleep. 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 Other than that, he has no identifiable psychosocial issues. He scored a 5 on his PHQ-9 and reports that he copes well on his own. His main support system is his wife. His goal for the program is to reduce his SOB with activity.    Expected Outcomes Pt will continue to not have any identifiable psychosocial issues.    Continue Psychosocial Services  No Follow up required             Psychosocial Re-Evaluation:  Psychosocial Re-Evaluation     Row Name 03/18/21 [phone]             Psychosocial Re-Evaluation   Current issues with Current Sleep Concerns  PTSD       Comments Patient is new to the program completing 3 sessions. He continues to have no psychosocial barriers identified. 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 We will continue to monitor.       Expected Outcomes Patient will continue to have no psychosocial barriers identified.       Interventions Stress management education;Encouraged to attend Cardiac Rehabilitation for the exercise;Relaxation education       Continue Psychosocial Services  No Follow up required                Psychosocial Discharge (Final Psychosocial Re-Evaluation):  Psychosocial Re-Evaluation - 03/18/21 0948       Psychosocial Re-Evaluation   Current issues with Current Sleep Concerns   PTSD   Comments Patient is new to the program completing 3 sessions. He continues to have no psychosocial barriers identified. 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 We will continue to monitor.     Expected Outcomes Patient will continue to have no psychosocial barriers identified.    Interventions Stress management education;Encouraged to attend Cardiac Rehabilitation for the exercise;Relaxation education    Continue Psychosocial Services  No Follow up required             Vocational Rehabilitation: Provide vocational rehab assistance to qualifying candidates.   Vocational Rehab Evaluation & Intervention:  Vocational Rehab - 03/02/21 1300       Initial Vocational Rehab Evaluation & Intervention   Assessment shows need for Vocational Rehabilitation No             Education: Education Goals: Education classes will be provided on a weekly basis, covering required topics. Participant will state understanding/return demonstration of topics presented.  Learning Barriers/Preferences:  Learning Barriers/Preferences - 03/02/21 1256       Learning Barriers/Preferences   Learning Barriers None    Learning Preferences Skilled Demonstration             Education Topics: Hypertension, Hypertension Reduction -Define heart disease and high blood pressure. Discus how high blood pressure affects the body and ways to reduce high blood pressure.   Exercise and Your Heart -Discuss why it is important to exercise, the FITT principles of exercise, normal and abnormal responses to exercise, and how to exercise safely.   Angina -Discuss definition of angina, causes of angina, treatment of angina, and how to decrease risk of having angina.   Cardiac Medications -Review what the following cardiac medications are used for, how they affect the body, and side effects that may occur when taking the medications.  Medications include Aspirin, Beta blockers, calcium channel blockers, ACE Inhibitors, angiotensin receptor blockers, diuretics, digoxin, and antihyperlipidemics.   Congestive Heart Failure -Discuss the definition of CHF, how to live with CHF, the signs and symptoms of  CHF, and how keep track of weight and sodium intake.   Heart Disease and Intimacy -Discus the effect sexual activity has on the heart, how changes occur during intimacy as we age, and safety during sexual activity.   Smoking Cessation / COPD -Discuss different methods to quit smoking, the health benefits of quitting smoking, and the definition of COPD.   Nutrition I: Fats -Discuss the types of cholesterol, what cholesterol does to the heart, and how cholesterol levels can be controlled.   Nutrition II: Labels -Discuss the different components of food labels and how to read food label   Heart Parts/Heart Disease and PAD -Discuss the anatomy of the heart, the pathway of blood circulation  through the heart, and these are affected by heart disease.   Stress I: Signs and Symptoms -Discuss the causes of stress, how stress may lead to anxiety and depression, and ways to limit stress.   Stress II: Relaxation -Discuss different types of relaxation techniques to limit stress.   Warning Signs of Stroke / TIA -Discuss definition of a stroke, what the signs and symptoms are of a stroke, and how to identify when someone is having stroke.   Knowledge Questionnaire Score:  Knowledge Questionnaire Score - 03/02/21 1256       Knowledge Questionnaire Score   Pre Score 17/24             Core Components/Risk Factors/Patient Goals at Admission:  Personal Goals and Risk Factors at Admission - 03/02/21 1300       Core Components/Risk Factors/Patient Goals on Admission    Weight Management Yes;Weight Loss    Intervention Weight Management: Develop a combined nutrition and exercise program designed to reach desired caloric intake, while maintaining appropriate intake of nutrient and fiber, sodium and fats, and appropriate energy expenditure required for the weight goal.;Weight Management: Provide education and appropriate resources to help participant work on and attain dietary goals.     Admit Weight 229 lb (103.9 kg)    Goal Weight: Short Term 220 lb (99.8 kg)    Expected Outcomes Short Term: Continue to assess and modify interventions until short term weight is achieved;Long Term: Adherence to nutrition and physical activity/exercise program aimed toward attainment of established weight goal;Weight Loss: Understanding of general recommendations for a balanced deficit meal plan, which promotes 1-2 lb weight loss per week and includes a negative energy balance of [phone] kcal/d;Understanding recommendations for meals to include 15-35% energy as protein, 25-35% energy from fat, 35-60% energy from carbohydrates, less than  of dietary cholesterol, 20-35 gm of total fiber daily;Understanding of distribution of calorie intake throughout the day with the consumption of 4-5 meals/snacks    Improve shortness of breath with ADL's Yes    Intervention Provide education, individualized exercise plan and daily activity instruction to help decrease symptoms of SOB with activities of daily living.    Expected Outcomes Short Term: Improve cardiorespiratory fitness to achieve a reduction of symptoms when performing ADLs;Long Term: Be able to perform more ADLs without symptoms or delay the onset of symptoms    Diabetes Yes    Intervention Provide education about signs/symptoms and action to take for hypo/hyperglycemia.;Provide education about proper nutrition, including hydration, and aerobic/resistive exercise prescription along with prescribed medications to achieve blood glucose in normal ranges: Fasting glucose 65-99 mg/dL    Expected Outcomes Short Term: Participant verbalizes understanding of the signs/symptoms and immediate care of hyper/hypoglycemia, proper foot care and importance of medication, aerobic/resistive exercise and nutrition plan for blood glucose control.;Long Term: Attainment of HbA1C < 7%.             Core Components/Risk Factors/Patient Goals Review:   Goals and Risk  Factor Review     Row Name 03/18/21 0951             Core Components/Risk Factors/Patient Goals Review   Personal Goals Review Weight Management/Obesity;Other;Improve shortness of breath with ADL's       Review Patient was referred to CR with [facility]. He has multiple risk factors for CAD and is participating in the program for risk modification. He has completed 3 sessions. His personal goals for the program are to reduce his SOB with activity. We will continue to  monitor his progress as he works towards meeting these goals.       Expected Outcomes Patient will complete the program meeting both personal and program goals.                Core Components/Risk Factors/Patient Goals at Discharge (Final Review):   Goals and Risk Factor Review - 03/18/21 0951       Core Components/Risk Factors/Patient Goals Review   Personal Goals Review Weight Management/Obesity;Other;Improve shortness of breath with ADL's    Review Patient was referred to CR with [facility]. He has multiple risk factors for CAD and is participating in the program for risk modification. He has completed 3 sessions. His personal goals for the program are to reduce his SOB with activity. We will continue to monitor his progress as he works towards meeting these goals.    Expected Outcomes Patient will complete the program meeting both personal and program goals.             ITP Comments:   Comments: ITP REVIEW Pt is making expected progress toward Cardiac Rehab goals after completing 5 sessions. Recommend continued exercise, life style modification, education, and increased stamina and strength.

## 2021-03-27 ENCOUNTER — Other Ambulatory Visit: Payer: Self-pay

## 2021-03-27 ENCOUNTER — Encounter (HOSPITAL_COMMUNITY)
Admission: RE | Admit: 2021-03-27 | Discharge: 2021-03-27 | Disposition: A | Discharge status: Home / Self Care | Payer: 59 | Source: Ambulatory Visit | Attending: Cardiovascular Disease | Admitting: Cardiovascular Disease

## 2021-03-27 DIAGNOSIS — Z951 Presence of aortocoronary bypass graft: Secondary | ICD-10-CM

## 2021-03-27 DIAGNOSIS — I2102 ST elevation (STEMI) myocardial infarction involving left anterior descending coronary artery: Secondary | ICD-10-CM

## 2021-03-27 NOTE — Progress Notes (Signed)
Daily Session Note  Patient Details  Name: Allen Walton MRN: [id number] Date of Birth: 1970-09-22 Referring Provider:   Flowsheet Row CARDIAC REHAB PHASE II ORIENTATION from 03/02/2021 in [place]  Referring Provider Dr. Cyndia Bent       Encounter Date: 03/27/2021  Check In:  Session Check In - 03/27/21 0815       Check-In   Supervising physician immediately available to respond to emergencies [facility] MD immediately available    Physician(s) Dr. Harrington Challenger    Location [facility]    Staff Present Aundra Dubin, RN, BSN;Other;Dalton Fletcher, MS, ACSM-CEP, Exercise Physiologist    Medication changes reported     No    Fall or balance concerns reported    No    Tobacco Cessation No Change    Warm-up and Cool-down Performed as group-led instruction    Resistance Training Performed Yes    VAD Patient? No    PAD/SET Patient? No      Pain Assessment   Currently in Pain? No/denies    Multiple Pain Sites No             Capillary Blood Glucose: No results found for this or any previous visit (from the past 24 hour(s)).    Social History   Tobacco Use  Smoking Status Some Days   Types: Cigarettes  Smokeless Tobacco Never    Goals Met:  Independence with exercise equipment Exercise tolerated well No report of concerns or symptoms today Strength training completed today  Goals Unmet:  Not Applicable  Comments: check out 0915   Dr. Kathie Dike is Medical Director for [facility] Pulmonary Rehab.

## 2021-03-30 ENCOUNTER — Other Ambulatory Visit: Payer: Self-pay

## 2021-03-30 ENCOUNTER — Encounter (HOSPITAL_COMMUNITY)
Admission: RE | Admit: 2021-03-30 | Discharge: 2021-03-30 | Disposition: A | Discharge status: Home / Self Care | Payer: 59 | Source: Ambulatory Visit | Attending: Cardiovascular Disease | Admitting: Cardiovascular Disease

## 2021-03-30 DIAGNOSIS — I2102 ST elevation (STEMI) myocardial infarction involving left anterior descending coronary artery: Secondary | ICD-10-CM | POA: Diagnosis not present

## 2021-03-30 DIAGNOSIS — Z951 Presence of aortocoronary bypass graft: Secondary | ICD-10-CM

## 2021-03-30 NOTE — Progress Notes (Signed)
Daily Session Note  Patient Details  Name: Allen Walton MRN: [id number] Date of Birth: 01-30-1971 Referring Provider:   Flowsheet Row CARDIAC REHAB PHASE II ORIENTATION from 03/02/2021 in [place]  Referring Provider Dr. Cyndia Bent       Encounter Date: 03/30/2021  Check In:  Session Check In - 03/30/21 0832       Check-In   Supervising physician immediately available to respond to emergencies [facility] MD immediately available    Physician(s) Dr. Harl Bowie    Location [facility]    Staff Present Redge Gainer, BS, Exercise Physiologist;Debra Wynetta Emery, RN, Bjorn Loser, MS, ACSM-CEP, Exercise Physiologist    Virtual Visit No    Medication changes reported     No    Fall or balance concerns reported    No    Tobacco Cessation No Change    Warm-up and Cool-down Performed as group-led instruction    Resistance Training Performed Yes    VAD Patient? No    PAD/SET Patient? No      Pain Assessment   Currently in Pain? No/denies    Multiple Pain Sites No             Capillary Blood Glucose: No results found for this or any previous visit (from the past 24 hour(s)).    Social History   Tobacco Use  Smoking Status Some Days   Types: Cigarettes  Smokeless Tobacco Never    Goals Met:  Independence with exercise equipment Exercise tolerated well No report of concerns or symptoms today Strength training completed today  Goals Unmet:  Not Applicable  Comments: check out 0915   Dr. Kathie Dike is Medical Director for [facility].

## 2021-04-03 ENCOUNTER — Encounter (HOSPITAL_COMMUNITY)
Admission: RE | Admit: 2021-04-03 | Discharge: 2021-04-03 | Disposition: A | Discharge status: Home / Self Care | Payer: 59 | Source: Ambulatory Visit | Attending: Cardiovascular Disease | Admitting: Cardiovascular Disease

## 2021-04-03 ENCOUNTER — Other Ambulatory Visit: Payer: Self-pay

## 2021-04-03 DIAGNOSIS — I2102 ST elevation (STEMI) myocardial infarction involving left anterior descending coronary artery: Secondary | ICD-10-CM | POA: Diagnosis not present

## 2021-04-03 DIAGNOSIS — Z951 Presence of aortocoronary bypass graft: Secondary | ICD-10-CM

## 2021-04-03 NOTE — Progress Notes (Signed)
Daily Session Note  Patient Details  Name: Allen Walton MRN: [id number] Date of Birth: [birth year] Referring Provider:   Flowsheet Row CARDIAC REHAB PHASE II ORIENTATION from 03/02/2021 in [place]  Referring Provider Dr. Cyndia Bent       Encounter Date: 04/03/2021  Check In:  Session Check In - 04/03/21 0815       Check-In   Supervising physician immediately available to respond to emergencies [facility] immediately available    Physician(s) Dr. Harl Bowie    Location [facility]    Staff Present Hoy Register, MS, ACSM-CEP, Exercise Physiologist;Yavier Snider Zigmund Daniel, Exercise Physiologist;Debra Wynetta Emery, RN, BSN    Virtual Visit No    Medication changes reported     No    Fall or balance concerns reported    No    Tobacco Cessation No Change    Warm-up and Cool-down Performed as group-led instruction    Resistance Training Performed Yes    VAD Patient? No    PAD/SET Patient? No      Pain Assessment   Currently in Pain? No/denies    Multiple Pain Sites No             Capillary Blood Glucose: No results found for this or any previous visit (from the past 24 hour(s)).    Social History   Tobacco Use  Smoking Status Some Days   Types: Cigarettes  Smokeless Tobacco Never    Goals Met:  Independence with exercise equipment Exercise tolerated well No report of concerns or symptoms today Strength training completed today  Goals Unmet:  Not Applicable  Comments: check out 0915   Dr. Kathie Dike is Medical Director for [facility] Pulmonary Rehab.

## 2021-04-03 NOTE — Progress Notes (Signed)
I have reviewed a Home Exercise Prescription with Allen Walton . Allen Walton is currently exercising at home.  The patient was advised to walk 3 days a week for 30-45 minutes.  Allen Walton and I discussed how to progress their exercise prescription.  The patient stated that their goals were lose weight and increase exercise level.  The patient stated that they understand the exercise prescription.  We reviewed exercise guidelines, target heart rate during exercise, RPE Scale, weather conditions, NTG use, endpoints for exercise, warmup and cool down.  Patient is encouraged to come to me with any questions. I will continue to follow up with the patient to assist them with progression and safety.

## 2021-04-06 ENCOUNTER — Other Ambulatory Visit: Payer: Self-pay

## 2021-04-06 ENCOUNTER — Encounter (HOSPITAL_COMMUNITY)
Admission: RE | Admit: 2021-04-06 | Discharge: 2021-04-06 | Disposition: A | Discharge status: Home / Self Care | Payer: 59 | Source: Ambulatory Visit | Attending: Cardiovascular Disease | Admitting: Cardiovascular Disease

## 2021-04-06 VITALS — Wt 229.3 lb

## 2021-04-06 DIAGNOSIS — I2102 ST elevation (STEMI) myocardial infarction involving left anterior descending coronary artery: Secondary | ICD-10-CM | POA: Diagnosis not present

## 2021-04-06 DIAGNOSIS — Z951 Presence of aortocoronary bypass graft: Secondary | ICD-10-CM

## 2021-04-06 NOTE — Progress Notes (Signed)
Daily Session Note  Patient Details  Name: Allen Walton MRN: [id number] Date of Birth: 12/16/1970 Referring Provider:   Flowsheet Row CARDIAC REHAB PHASE II ORIENTATION from 03/02/2021 in [place]  Referring Provider Dr. Cyndia Bent       Encounter Date: 04/06/2021  Check In:  Session Check In - 04/06/21 0815       Check-In   Supervising physician immediately available to respond to emergencies [facility] immediately available    Physician(s) Dr. Johnsie Cancel    Location [facility]    Staff Present Maurice Small, RN, Bjorn Loser, MS, ACSM-CEP, Exercise Physiologist;Mortimer Bair Zigmund Daniel, Exercise Physiologist    Virtual Visit No    Medication changes reported     No    Fall or balance concerns reported    No    Tobacco Cessation No Change    Warm-up and Cool-down Performed as group-led instruction    Resistance Training Performed Yes    VAD Patient? No    PAD/SET Patient? No      Pain Assessment   Currently in Pain? No/denies    Multiple Pain Sites No             Capillary Blood Glucose: No results found for this or any previous visit (from the past 24 hour(s)).    Social History   Tobacco Use  Smoking Status Some Days   Types: Cigarettes  Smokeless Tobacco Never    Goals Met:  Independence with exercise equipment Exercise tolerated well No report of concerns or symptoms today Strength training completed today  Goals Unmet:  Not Applicable  Comments: check out 0915   Dr. Kathie Dike is Medical Director for [facility] Pulmonary Rehab.

## 2021-04-10 ENCOUNTER — Encounter (HOSPITAL_COMMUNITY)
Admission: RE | Admit: 2021-04-10 | Discharge: 2021-04-10 | Disposition: A | Discharge status: Home / Self Care | Payer: 59 | Source: Ambulatory Visit | Attending: Cardiovascular Disease | Admitting: Cardiovascular Disease

## 2021-04-10 ENCOUNTER — Other Ambulatory Visit: Payer: Self-pay

## 2021-04-10 DIAGNOSIS — I2102 ST elevation (STEMI) myocardial infarction involving left anterior descending coronary artery: Secondary | ICD-10-CM | POA: Diagnosis not present

## 2021-04-10 DIAGNOSIS — Z951 Presence of aortocoronary bypass graft: Secondary | ICD-10-CM

## 2021-04-10 NOTE — Progress Notes (Signed)
Daily Session Note  Patient Details  Name: Allen Walton MRN: [id number] Date of Birth: [birth year] Referring Provider:   Flowsheet Row CARDIAC REHAB PHASE II ORIENTATION from 03/02/2021 in [place]  Referring Provider Dr. Cyndia Bent       Encounter Date: 04/10/2021  Check In:  Session Check In - 04/10/21 0815       Check-In   Supervising physician immediately available to respond to emergencies [facility] immediately available    Physician(s) Dr. Johnsie Cancel    Location [facility]    Staff Present Hoy Register, MS, ACSM-CEP, Exercise Physiologist;Debra Wynetta Emery, RN, BSN;Heather Otho Ket, BS, Exercise Physiologist    Virtual Visit No    Medication changes reported     No    Fall or balance concerns reported    No    Tobacco Cessation No Change    Warm-up and Cool-down Performed as group-led instruction    Resistance Training Performed Yes    VAD Patient? No    PAD/SET Patient? No      Pain Assessment   Currently in Pain? No/denies    Multiple Pain Sites No             Capillary Blood Glucose: No results found for this or any previous visit (from the past 24 hour(s)).    Social History   Tobacco Use  Smoking Status Some Days   Types: Cigarettes  Smokeless Tobacco Never    Goals Met:  Independence with exercise equipment Exercise tolerated well No report of concerns or symptoms today Strength training completed today  Goals Unmet:  Not Applicable  Comments: checkout time is 0915   Dr. Kathie Dike is Medical Director for [facility] Pulmonary Rehab.

## 2021-04-13 ENCOUNTER — Encounter (HOSPITAL_COMMUNITY)
Admission: RE | Admit: 2021-04-13 | Discharge: 2021-04-13 | Disposition: A | Discharge status: Home / Self Care | Payer: 59 | Source: Ambulatory Visit | Attending: Cardiovascular Disease | Admitting: Cardiovascular Disease

## 2021-04-13 ENCOUNTER — Other Ambulatory Visit: Payer: Self-pay

## 2021-04-13 DIAGNOSIS — Z951 Presence of aortocoronary bypass graft: Secondary | ICD-10-CM | POA: Insufficient documentation

## 2021-04-13 DIAGNOSIS — I2102 ST elevation (STEMI) myocardial infarction involving left anterior descending coronary artery: Secondary | ICD-10-CM | POA: Insufficient documentation

## 2021-04-13 NOTE — Progress Notes (Signed)
Daily Session Note  Patient Details  Name: Allen Walton MRN: [id number] Date of Birth: 1970/10/31 Referring Provider:   Flowsheet Row CARDIAC REHAB PHASE II ORIENTATION from 03/02/2021 in [place]  Referring Provider Dr. Cyndia Bent       Encounter Date: 04/13/2021  Check In:  Session Check In - 04/13/21 0815       Check-In   Supervising physician immediately available to respond to emergencies [facility] immediately available    Physician(s) Dr. Domenic Polite    Location [facility]    Staff Present Hoy Register, MS, ACSM-CEP, Exercise Physiologist;Debra Wynetta Emery, RN, BSN    Virtual Visit No    Medication changes reported     No    Fall or balance concerns reported    No    Tobacco Cessation No Change    Warm-up and Cool-down Performed as group-led instruction    Resistance Training Performed Yes    VAD Patient? No    PAD/SET Patient? No      Pain Assessment   Currently in Pain? No/denies    Multiple Pain Sites No             Capillary Blood Glucose: No results found for this or any previous visit (from the past 24 hour(s)).    Social History   Tobacco Use  Smoking Status Some Days   Types: Cigarettes  Smokeless Tobacco Never    Goals Met:  Independence with exercise equipment Exercise tolerated well No report of concerns or symptoms today Strength training completed today  Goals Unmet:  Not Applicable  Comments: checkout time 0915   Dr. Kathie Dike is Medical Director for [facility] Pulmonary Rehab.

## 2021-04-17 ENCOUNTER — Other Ambulatory Visit: Payer: Self-pay

## 2021-04-17 ENCOUNTER — Encounter (HOSPITAL_COMMUNITY)
Admission: RE | Admit: 2021-04-17 | Discharge: 2021-04-17 | Disposition: A | Discharge status: Home / Self Care | Payer: 59 | Source: Ambulatory Visit | Attending: Cardiovascular Disease | Admitting: Cardiovascular Disease

## 2021-04-17 DIAGNOSIS — I2102 ST elevation (STEMI) myocardial infarction involving left anterior descending coronary artery: Secondary | ICD-10-CM

## 2021-04-17 DIAGNOSIS — Z951 Presence of aortocoronary bypass graft: Secondary | ICD-10-CM

## 2021-04-17 NOTE — Progress Notes (Signed)
Daily Session Note  Patient Details  Name: Allen Walton MRN: [id number] Date of Birth: October 03, 1970 Referring Provider:   Flowsheet Row CARDIAC REHAB PHASE II ORIENTATION from 03/02/2021 in [place]  Referring Provider Dr. Cyndia Bent       Encounter Date: 04/17/2021  Check In:  Session Check In - 04/17/21 0815       Check-In   Supervising physician immediately available to respond to emergencies [facility] immediately available    Physician(s) Dr. Harl Bowie    Location [facility]    Staff Present Redge Gainer, BS, Exercise Physiologist;Debra Wynetta Emery, RN, BSN    Virtual Visit No    Medication changes reported     No    Fall or balance concerns reported    No    Tobacco Cessation No Change    Warm-up and Cool-down Performed as group-led instruction    Resistance Training Performed Yes    VAD Patient? No    PAD/SET Patient? No      Pain Assessment   Currently in Pain? No/denies    Multiple Pain Sites No             Capillary Blood Glucose: No results found for this or any previous visit (from the past 24 hour(s)).    Social History   Tobacco Use  Smoking Status Some Days   Types: Cigarettes  Smokeless Tobacco Never    Goals Met:  Independence with exercise equipment Exercise tolerated well No report of concerns or symptoms today Strength training completed today  Goals Unmet:  Not Applicable  Comments: check out 0915   Dr. Kathie Dike is Medical Director for [facility] Pulmonary Rehab.

## 2021-04-20 ENCOUNTER — Other Ambulatory Visit: Payer: Self-pay

## 2021-04-20 ENCOUNTER — Encounter (HOSPITAL_COMMUNITY)
Admission: RE | Admit: 2021-04-20 | Discharge: 2021-04-20 | Disposition: A | Discharge status: Home / Self Care | Payer: 59 | Source: Ambulatory Visit | Attending: Cardiovascular Disease | Admitting: Cardiovascular Disease

## 2021-04-20 VITALS — Wt 225.1 lb

## 2021-04-20 DIAGNOSIS — Z951 Presence of aortocoronary bypass graft: Secondary | ICD-10-CM

## 2021-04-20 DIAGNOSIS — I2102 ST elevation (STEMI) myocardial infarction involving left anterior descending coronary artery: Secondary | ICD-10-CM

## 2021-04-20 NOTE — Progress Notes (Signed)
Daily Session Note  Patient Details  Name: Allen Walton MRN: [id number] Date of Birth: 05-09-1971 Referring Provider:   Flowsheet Row CARDIAC REHAB PHASE II ORIENTATION from 03/02/2021 in [place]  Referring Provider Dr. Cyndia Bent       Encounter Date: 04/20/2021  Check In:  Session Check In - 04/20/21 0815       Check-In   Supervising physician immediately available to respond to emergencies [facility] MD immediately available    Physician(s) Dr. Gardiner Rhyme    Location [facility]    Staff Present Hoy Register, MS, ACSM-CEP, Exercise Physiologist;Debra Wynetta Emery, RN, BSN    Virtual Visit No    Medication changes reported     No    Fall or balance concerns reported    No    Tobacco Cessation No Change    Warm-up and Cool-down Performed as group-led instruction    Resistance Training Performed Yes    VAD Patient? No    PAD/SET Patient? No      Pain Assessment   Currently in Pain? No/denies    Multiple Pain Sites No             Capillary Blood Glucose: No results found for this or any previous visit (from the past 24 hour(s)).    Social History   Tobacco Use  Smoking Status Some Days   Types: Cigarettes  Smokeless Tobacco Never    Goals Met:  Independence with exercise equipment Exercise tolerated well No report of concerns or symptoms today Strength training completed today  Goals Unmet:  Not Applicable  Comments: checkout time is 0915   Dr. Kathie Dike is Medical Director for [facility] Pulmonary Rehab.

## 2021-04-22 NOTE — Progress Notes (Signed)
Cardiac Individual Treatment Plan  Patient Details  Name: Allen Walton MRN: [id number] Date of Birth: 11/05/1970 Referring Provider:   Flowsheet Row CARDIAC REHAB PHASE II ORIENTATION from 03/02/2021 in [facility]  Referring Provider Dr. Cyndia Bent       Initial Encounter Date:  Flowsheet Row CARDIAC REHAB PHASE II ORIENTATION from 03/02/2021 in [facility]  Date 03/02/21       Visit Diagnosis: ST elevation myocardial infarction involving left anterior descending (LAD) coronary artery (HCC)  S/P CABG x 1  Patient's Home Medications on Admission:  Current Outpatient Medications:    amiodarone (PACERONE) 200 MG tablet, Take 2 tablets (400 mg total) by mouth 2 (two) times daily for 5 days then reduce the dose to 1 tablet (200mg ) by mouth twice daily., Disp: 60 tablet, Rfl: 1   aspirin 81 MG EC tablet, Take 1 tablet (81 mg total) by mouth daily. Swallow whole., Disp: 30 tablet, Rfl: 11   atorvastatin (LIPITOR) 80 MG tablet, Take 1 tablet (80 mg total) by mouth daily., Disp: 30 tablet, Rfl: 2   clopidogrel (PLAVIX) 75 MG tablet, Take 1 tablet (75 mg total) by mouth daily., Disp: 30 tablet, Rfl: 11   glipiZIDE (GLUCOTROL) 5 MG tablet, Take 2.5 mg by mouth 2 (two) times daily before a meal., Disp: , Rfl:    metFORMIN (GLUCOPHAGE) 1000 MG tablet, Take 1,000 mg by mouth 2 (two) times daily with a meal., Disp: , Rfl:    metoprolol tartrate (LOPRESSOR) 25 MG tablet, Take 1 tablet (25 mg total) by mouth 2 (two) times daily., Disp: 60 tablet, Rfl: 2   TechLite Lancets MISC, Inject 1 applicator into the skin daily as needed., Disp: , Rfl:   Past Medical History: Past Medical History:  Diagnosis Date   Anginal pain ([place])    Diabetes mellitus without complication ([place])    Myocardial infarction ([place])     Tobacco Use: Social History   Tobacco Use  Smoking Status Some Days   Types: Cigarettes  Smokeless Tobacco Never    Labs: Recent Review  Flowsheet Data     Labs for ITP Cardiac and Pulmonary Rehab Latest Ref Rng & Units 11/29/2020 11/29/2020 11/29/2020 11/30/2020 12/02/2020   Cholestrol 0 - 200 mg/dL - - - - 107   LDLCALC 0 - 99 mg/dL - - - - 63   HDL >40 mg/dL - - - - 16(L)   Trlycerides <150 mg/dL - - - - 139   Hemoglobin A1c 4.8 - 5.6 % - - - - -   PHART 7.350 - 7.450 7.328(L) 7.331(L) 7.328(L) 7.309(L) -   PCO2ART 32.0 - 48.0 mmHg 46.0 49.7(H) 45.0 45.6 -   HCO3 20.0 - 28.0 mmol/L 24.2 26.4 23.6 22.8 -   TCO2 22 - 32 mmol/L 26 28 25 24  -   ACIDBASEDEF 0.0 - 2.0 mmol/L 2.0 - 2.0 3.0(H) -   O2SAT % 94.0 99.0 99.0 97.0 -       Capillary Blood Glucose: Lab Results  Component Value Date   GLUCAP 114 (H) 03/06/2021   GLUCAP 137 (H) 03/02/2021   GLUCAP 130 (H) 12/04/2020   GLUCAP 137 (H) 12/03/2020   GLUCAP 139 (H) 12/03/2020     Exercise Target Goals: Exercise Program Goal: Individual exercise prescription set using results from initial 6 min walk test and THRR while considering  patient's activity barriers and safety.   Exercise Prescription Goal: Starting with aerobic activity 30 plus minutes a day, 3 days per  week for initial exercise prescription. Provide home exercise prescription and guidelines that participant acknowledges understanding prior to discharge.  Activity Barriers & Risk Stratification:  Activity Barriers & Cardiac Risk Stratification - 03/02/21 1251       Activity Barriers & Cardiac Risk Stratification   Activity Barriers Arthritis    Cardiac Risk Stratification High             6 Minute Walk:  6 Minute Walk     Row Name 03/02/21 1410         6 Minute Walk   Phase Initial     Distance 1900 feet     Walk Time 6 minutes     # of Rest Breaks 0     MPH 3.6     METS 4.91     RPE 11     VO2 Peak 17.17     Symptoms No     Resting HR 82 bpm     Resting BP 124/56     Resting Oxygen Saturation  98 %     Exercise Oxygen Saturation  during 6 min walk 100 %     Max Ex. HR 103 bpm      Max Ex. BP 134/50     2 Minute Post BP 112/54              Oxygen Initial Assessment:   Oxygen Re-Evaluation:   Oxygen Discharge (Final Oxygen Re-Evaluation):   Initial Exercise Prescription:  Initial Exercise Prescription - 03/02/21 1400       Date of Initial Exercise RX and Referring Provider   Date 03/02/21    Referring Provider Dr. Cyndia Bent    Expected Discharge Date 05/25/21      Treadmill   MPH 3    Grade 0    Minutes 17      Recumbant Elliptical   Level 1    RPM 60    Minutes 22      Prescription Details   Frequency (times per week) 3    Duration Progress to 30 minutes of continuous aerobic without signs/symptoms of physical distress      Intensity   THRR 40-80% of Max Heartrate 68-135    Ratings of Perceived Exertion 11-13    Perceived Dyspnea 0-4      Resistance Training   Training Prescription Yes    Weight 5 lbs    Reps 10-15             Perform Capillary Blood Glucose checks as needed.  Exercise Prescription Changes:   Exercise Prescription Changes     Row Name 03/09/21 0815 03/23/21 0815 04/03/21 0800 04/06/21 0900 04/20/21 1200     Response to Exercise   Blood Pressure (Admit) 120/58 112/60 -- 125/60 110/62   Blood Pressure (Exercise) 138/68 132/60 -- 168/60 138/60   Blood Pressure (Exit) 110/58 108/58 -- 130/60 106/70   Heart Rate (Admit) 70 bpm 76 bpm -- 99 bpm 72 bpm   Heart Rate (Exercise) 90 bpm 105 bpm -- 132 bpm 102 bpm   Heart Rate (Exit) 73 bpm 84 bpm -- 105 bpm 81 bpm   Rating of Perceived Exertion (Exercise) 12 12 -- -- 12   Duration Continue with 30 min of aerobic exercise without signs/symptoms of physical distress. Continue with 30 min of aerobic exercise without signs/symptoms of physical distress. -- Continue with 30 min of aerobic exercise without signs/symptoms of physical distress. Continue with 30 min of aerobic exercise without signs/symptoms of physical  distress.   Intensity THRR unchanged THRR unchanged --  THRR unchanged THRR unchanged     Progression   Progression Continue to progress workloads to maintain intensity without signs/symptoms of physical distress. Continue to progress workloads to maintain intensity without signs/symptoms of physical distress. -- Continue to progress workloads to maintain intensity without signs/symptoms of physical distress. Continue to progress workloads to maintain intensity without signs/symptoms of physical distress.     Resistance Training   Training Prescription Yes Yes -- Yes Yes   Weight 5 lbs 5 lbs -- 8 5   Reps 10-15 10-15 -- 10-15 10-15   Time 10 Minutes 10 Minutes -- 10 Minutes 10 Minutes     Treadmill   MPH 3 3.1 -- 3.1 3   Grade 0 0 -- 2.5 2.5   Minutes 17 17 -- 17 17   METs 3.3 3.37 -- 4.44 4.33     Recumbant Elliptical   Level 1 3 -- 3 4   RPM 56 58 -- 58 67   Minutes 22 22 -- 22 22   METs 3.9 3 -- 2.8 3.9     Home Exercise Plan   Plans to continue exercise at -- -- Home (comment) -- --   Frequency -- -- Add 3 additional days to program exercise sessions. -- --   Initial Home Exercises Provided -- -- 04/03/21 -- --            Exercise Comments:   Exercise Comments     Row Name 04/03/21 1610           Exercise Comments home exercise reviewed                Exercise Goals and Review:   Exercise Goals     Row Name 03/02/21 1413 03/24/21 0910 04/20/21 1247         Exercise Goals   Increase Physical Activity Yes Yes Yes     Intervention Provide advice, education, support and counseling about physical activity/exercise needs.;Develop an individualized exercise prescription for aerobic and resistive training based on initial evaluation findings, risk stratification, comorbidities and participant's personal goals. Provide advice, education, support and counseling about physical activity/exercise needs.;Develop an individualized exercise prescription for aerobic and resistive training based on initial evaluation  findings, risk stratification, comorbidities and participant's personal goals. Provide advice, education, support and counseling about physical activity/exercise needs.;Develop an individualized exercise prescription for aerobic and resistive training based on initial evaluation findings, risk stratification, comorbidities and participant's personal goals.     Expected Outcomes Short Term: Attend rehab on a regular basis to increase amount of physical activity.;Long Term: Add in home exercise to make exercise part of routine and to increase amount of physical activity.;Long Term: Exercising regularly at least 3-5 days a week. Short Term: Attend rehab on a regular basis to increase amount of physical activity.;Long Term: Add in home exercise to make exercise part of routine and to increase amount of physical activity.;Long Term: Exercising regularly at least 3-5 days a week. Short Term: Attend rehab on a regular basis to increase amount of physical activity.;Long Term: Add in home exercise to make exercise part of routine and to increase amount of physical activity.;Long Term: Exercising regularly at least 3-5 days a week.     Increase Strength and Stamina Yes Yes Yes     Intervention Provide advice, education, support and counseling about physical activity/exercise needs.;Develop an individualized exercise prescription for aerobic and resistive training based on initial evaluation findings, risk stratification,  comorbidities and participant's personal goals. Provide advice, education, support and counseling about physical activity/exercise needs.;Develop an individualized exercise prescription for aerobic and resistive training based on initial evaluation findings, risk stratification, comorbidities and participant's personal goals. Provide advice, education, support and counseling about physical activity/exercise needs.;Develop an individualized exercise prescription for aerobic and resistive training based on  initial evaluation findings, risk stratification, comorbidities and participant's personal goals.     Expected Outcomes Short Term: Increase workloads from initial exercise prescription for resistance, speed, and METs.;Short Term: Perform resistance training exercises routinely during rehab and add in resistance training at home;Long Term: Improve cardiorespiratory fitness, muscular endurance and strength as measured by increased METs and functional capacity ( ) Short Term: Increase workloads from initial exercise prescription for resistance, speed, and METs.;Short Term: Perform resistance training exercises routinely during rehab and add in resistance training at home;Long Term: Improve cardiorespiratory fitness, muscular endurance and strength as measured by increased METs and functional capacity ( ) Short Term: Increase workloads from initial exercise prescription for resistance, speed, and METs.;Short Term: Perform resistance training exercises routinely during rehab and add in resistance training at home;Long Term: Improve cardiorespiratory fitness, muscular endurance and strength as measured by increased METs and functional capacity ( )     Able to understand and use rate of perceived exertion (RPE) scale Yes Yes Yes     Intervention Provide education and explanation on how to use RPE scale Provide education and explanation on how to use RPE scale Provide education and explanation on how to use RPE scale     Expected Outcomes Short Term: Able to use RPE daily in rehab to express subjective intensity level;Long Term:  Able to use RPE to guide intensity level when exercising independently Short Term: Able to use RPE daily in rehab to express subjective intensity level;Long Term:  Able to use RPE to guide intensity level when exercising independently Short Term: Able to use RPE daily in rehab to express subjective intensity level;Long Term:  Able to use RPE to guide intensity level when exercising  independently     Knowledge and understanding of Target Heart Rate Range (THRR) Yes Yes Yes     Intervention Provide education and explanation of THRR including how the numbers were predicted and where they are located for reference Provide education and explanation of THRR including how the numbers were predicted and where they are located for reference Provide education and explanation of THRR including how the numbers were predicted and where they are located for reference     Expected Outcomes Short Term: Able to state/look up THRR;Long Term: Able to use THRR to govern intensity when exercising independently;Short Term: Able to use daily as guideline for intensity in rehab Short Term: Able to state/look up THRR;Long Term: Able to use THRR to govern intensity when exercising independently;Short Term: Able to use daily as guideline for intensity in rehab Short Term: Able to state/look up THRR;Long Term: Able to use THRR to govern intensity when exercising independently;Short Term: Able to use daily as guideline for intensity in rehab     Able to check pulse independently Yes Yes Yes     Intervention Provide education and demonstration on how to check pulse in carotid and radial arteries.;Review the importance of being able to check your own pulse for safety during independent exercise Provide education and demonstration on how to check pulse in carotid and radial arteries.;Review the importance of being able to check your own pulse for safety during independent exercise Provide education and demonstration  on how to check pulse in carotid and radial arteries.;Review the importance of being able to check your own pulse for safety during independent exercise     Expected Outcomes Short Term: Able to explain why pulse checking is important during independent exercise;Long Term: Able to check pulse independently and accurately Short Term: Able to explain why pulse checking is important during independent  exercise;Long Term: Able to check pulse independently and accurately Short Term: Able to explain why pulse checking is important during independent exercise;Long Term: Able to check pulse independently and accurately     Understanding of Exercise Prescription Yes Yes Yes     Intervention Provide education, explanation, and written materials on patient's individual exercise prescription Provide education, explanation, and written materials on patient's individual exercise prescription Provide education, explanation, and written materials on patient's individual exercise prescription     Expected Outcomes Short Term: Able to explain program exercise prescription;Long Term: Able to explain home exercise prescription to exercise independently Short Term: Able to explain program exercise prescription;Long Term: Able to explain home exercise prescription to exercise independently Short Term: Able to explain program exercise prescription;Long Term: Able to explain home exercise prescription to exercise independently              Exercise Goals Re-Evaluation :  Exercise Goals Re-Evaluation     Row Name 03/24/21 0910 04/20/21 1247           Exercise Goal Re-Evaluation   Exercise Goals Review Increase Physical Activity;Increase Strength and Stamina;Able to understand and use rate of perceived exertion (RPE) scale;Knowledge and understanding of Target Heart Rate Range (THRR);Able to check pulse independently;Understanding of Exercise Prescription Increase Physical Activity;Increase Strength and Stamina;Able to understand and use rate of perceived exertion (RPE) scale;Knowledge and understanding of Target Heart Rate Range (THRR);Able to check pulse independently;Understanding of Exercise Prescription      Comments Pt has completed 5 sessions of cardiac rehab. He missed several sessions due to having COVID but has since returned with no problems. He was fit at his baseline and should progress well in the  program. He is currently exercising at 3.0 METs on the elliptical. Will continue to monitor and progress as able. Pt has completed 13 session of cardiac rehab. He continues to progress while in the program by increasing his work load and exercising at home. He is currently at a work load of 4.33 METs on the TM.      Expected Outcomes Through exercise at rehab and at home, the patient will meet their stated goals. Through exercise at rehab and at home, the patient will meet their stated goals.                Discharge Exercise Prescription (Final Exercise Prescription Changes):  Exercise Prescription Changes - 04/20/21 1200       Response to Exercise   Blood Pressure (Admit) 110/62    Blood Pressure (Exercise) 138/60    Blood Pressure (Exit) 106/70    Heart Rate (Admit) 72 bpm    Heart Rate (Exercise) 102 bpm    Heart Rate (Exit) 81 bpm    Rating of Perceived Exertion (Exercise) 12    Duration Continue with 30 min of aerobic exercise without signs/symptoms of physical distress.    Intensity THRR unchanged      Progression   Progression Continue to progress workloads to maintain intensity without signs/symptoms of physical distress.      Resistance Training   Training Prescription Yes    Weight 5  Reps 10-15    Time 10 Minutes      Treadmill   MPH 3    Grade 2.5    Minutes 17    METs 4.33      Recumbant Elliptical   Level 4    RPM 67    Minutes 22    METs 3.9             Nutrition:  Target Goals: Understanding of nutrition guidelines, daily intake of sodium 1500mg , cholesterol 200mg , calories 30% from fat and 7% or less from saturated fats, daily to have 5 or more servings of fruits and vegetables.  Biometrics:  Pre Biometrics - 03/02/21 1414       Pre Biometrics   Height 6\' 1"  (1.854 m)    Weight 105.7 kg    Waist Circumference 41.5 inches    Hip Circumference 44 inches    Waist to Hip Ratio 0.94 %    BMI (Calculated) 30.75    Triceps Skinfold 10  mm    % Body Fat 26.6 %    Grip Strength 37.6 kg    Flexibility 0 in    Single Leg Stand 46 seconds              Nutrition Therapy Plan and Nutrition Goals:   Nutrition Assessments:  Nutrition Assessments - 03/02/21 1254       MEDFICTS Scores   Pre Score 3            MEDIFICTS Score Key: ?70 Need to make dietary changes  40-70 Heart Healthy Diet ? 40 Therapeutic Level Cholesterol Diet   Picture Your Plate Scores: 03/04/21 Unhealthy dietary pattern with much room for improvement. 41-50 Dietary pattern unlikely to meet recommendations for good health and room for improvement. 51-60 More healthful dietary pattern, with some room for improvement.  >60 Healthy dietary pattern, although there may be some specific behaviors that could be improved.    Nutrition Goals Re-Evaluation:   Nutrition Goals Discharge (Final Nutrition Goals Re-Evaluation):   Psychosocial: Target Goals: Acknowledge presence or absence of significant depression and/or stress, maximize coping skills, provide positive support system. Participant is able to verbalize types and ability to use techniques and skills needed for reducing stress and depression.  Initial Review & Psychosocial Screening:  Initial Psych Review & Screening - 03/02/21 1253       Initial Review   Current issues with Current Sleep Concerns      Family Dynamics   Good Support System? Yes    Comments His main support system is his wife.      Barriers   Psychosocial barriers to participate in program There are no identifiable barriers or psychosocial needs.;The patient should benefit from training in stress management and relaxation.      Screening Interventions   Interventions Encouraged to exercise    Expected Outcomes Long Term goal: The participant improves quality of Life and PHQ9 Scores as seen by post scores and/or verbalization of changes;Short Term goal: Identification and review with participant of any Quality of  Life or Depression concerns found by scoring the questionnaire.             Quality of Life Scores:  Quality of Life - 03/02/21 1458       Quality of Life   Select Quality of Life      Quality of Life Scores   Health/Function Pre 21.2 %    Socioeconomic Pre 22.79 %    Psych/Spiritual Pre 25.29 %  Family Pre 24.6 %    GLOBAL Pre 22.87 %            Scores of 19 and below usually indicate a poorer quality of life in these areas.  A difference of  2-3 points is a clinically meaningful difference.  A difference of 2-3 points in the total score of the Quality of Life Index has been associated with significant improvement in overall quality of life, self-image, physical symptoms, and general health in studies assessing change in quality of life.  PHQ-9: Recent Review Flowsheet Data     Depression screen [facility] 2/9 03/02/2021   Decreased Interest 1   Down, Depressed, Hopeless 1   PHQ - 2 Score 2   Altered sleeping 1   Tired, decreased energy 0   Change in appetite 1   Feeling bad or failure about yourself  0   Trouble concentrating 1   Moving slowly or fidgety/restless 0   Suicidal thoughts 0   PHQ-9 Score 5   Difficult doing work/chores Somewhat difficult      Interpretation of Total Score  Total Score Depression Severity:  1-4 = Minimal depression, 5-9 = Mild depression, 10-14 = Moderate depression, 15-19 = Moderately severe depression, 20-27 = Severe depression   Psychosocial Evaluation and Intervention:  Psychosocial Evaluation - 03/02/21 1414       Psychosocial Evaluation & Interventions   Interventions Stress management education;Relaxation education;Encouraged to exercise with the program and follow exercise prescription    Comments Pt's only barrier to completing rehab could be work. He is going to do the program twice per week in order to reduce the amount of time that he misses at work. He will end up clocking into work an hour late on the days he comes to  rehab, but he states that his employer will let him stay an hour later to make up that hour. Pt does report that he does not get enough sleep. 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 Other than that, he has no identifiable psychosocial issues. He scored a 5 on his PHQ-9 and reports that he copes well on his own. His main support system is his wife. His goal for the program is to reduce his SOB with activity.    Expected Outcomes Pt will continue to not have any identifiable psychosocial issues.    Continue Psychosocial Services  No Follow up required             Psychosocial Re-Evaluation:  Psychosocial Re-Evaluation     Row Name 03/18/21 0948 04/13/21 1012           Psychosocial Re-Evaluation   Current issues with Current Sleep Concerns  PTSD Current Sleep Concerns  PTSD      Comments Patient is new to the program completing 3 sessions. He continues to have no psychosocial barriers identified. 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 We will continue to monitor. Patient has completed 11  sessions. He continues to have no psychosocial barriers identified. 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 He seems to enjoy coming to the program and demonstrates an interest in improving his health. We will continue to monitor.      Expected Outcomes Patient will continue to have no psychosocial barriers identified. Patient will continue  to have no psychosocial barriers identified.      Interventions Stress management education;Encouraged to attend Cardiac Rehabilitation for the exercise;Relaxation education Stress management education;Encouraged to attend Cardiac Rehabilitation for the exercise;Relaxation education      Continue Psychosocial Services  No Follow up required No Follow up required                Psychosocial Discharge (Final Psychosocial Re-Evaluation):  Psychosocial Re-Evaluation - 04/13/21 1012       Psychosocial Re-Evaluation   Current issues with Current Sleep Concerns   PTSD   Comments Patient has completed 11  sessions. He continues to have no psychosocial barriers identified. 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 He seems to enjoy coming to the program and demonstrates an interest in improving his health. We will continue to monitor.    Expected Outcomes Patient will continue to have no psychosocial barriers identified.    Interventions Stress management education;Encouraged to attend Cardiac Rehabilitation for the exercise;Relaxation education    Continue Psychosocial Services  No Follow up required             Vocational Rehabilitation: Provide vocational rehab assistance to qualifying candidates.   Vocational Rehab Evaluation & Intervention:  Vocational Rehab - 03/02/21 1300       Initial Vocational Rehab Evaluation & Intervention   Assessment shows need for Vocational Rehabilitation No             Education: Education Goals: Education classes will be provided on a weekly basis, covering required topics. Participant will state understanding/return demonstration of topics presented.  Learning Barriers/Preferences:  Learning Barriers/Preferences - 03/02/21 1256       Learning Barriers/Preferences   Learning Barriers None    Learning Preferences Skilled Demonstration             Education Topics: Hypertension, Hypertension Reduction -Define heart disease and high blood pressure. Discus how high blood pressure affects the body and ways to reduce high blood pressure.   Exercise and Your Heart -Discuss why it is important to exercise, the FITT principles of exercise, normal and abnormal responses to exercise, and how to exercise safely.   Angina -Discuss  definition of angina, causes of angina, treatment of angina, and how to decrease risk of having angina.   Cardiac Medications -Review what the following cardiac medications are used for, how they affect the body, and side effects that may occur when taking the medications.  Medications include Aspirin, Beta blockers, calcium channel blockers, ACE Inhibitors, angiotensin receptor blockers, diuretics, digoxin, and antihyperlipidemics.   Congestive Heart Failure -Discuss the definition of CHF, how to live with CHF, the signs and symptoms of CHF, and how keep track of weight and sodium intake.   Heart Disease and Intimacy -Discus the effect sexual activity has on the heart, how changes occur during intimacy as we age, and safety during sexual activity.   Smoking Cessation / COPD -Discuss different methods to quit smoking, the health benefits of quitting smoking, and the definition of COPD.   Nutrition I: Fats -Discuss the types of cholesterol, what cholesterol does to the heart, and how cholesterol levels can be controlled.   Nutrition II: Labels -Discuss the different components of food labels and how to read food label   Heart Parts/Heart Disease and PAD -Discuss the anatomy of the heart, the pathway of blood circulation through the heart, and these are affected by heart  disease.   Stress I: Signs and Symptoms -Discuss the causes of stress, how stress may lead to anxiety and depression, and ways to limit stress.   Stress II: Relaxation -Discuss different types of relaxation techniques to limit stress.   Warning Signs of Stroke / TIA -Discuss definition of a stroke, what the signs and symptoms are of a stroke, and how to identify when someone is having stroke.   Knowledge Questionnaire Score:  Knowledge Questionnaire Score - 03/02/21 1256       Knowledge Questionnaire Score   Pre Score 17/24             Core Components/Risk Factors/Patient Goals at Admission:   Personal Goals and Risk Factors at Admission - 03/02/21 1300       Core Components/Risk Factors/Patient Goals on Admission    Weight Management Yes;Weight Loss    Intervention Weight Management: Develop a combined nutrition and exercise program designed to reach desired caloric intake, while maintaining appropriate intake of nutrient and fiber, sodium and fats, and appropriate energy expenditure required for the weight goal.;Weight Management: Provide education and appropriate resources to help participant work on and attain dietary goals.    Admit Weight 229 lb (103.9 kg)    Goal Weight: Short Term 220 lb (99.8 kg)    Expected Outcomes Short Term: Continue to assess and modify interventions until short term weight is achieved;Long Term: Adherence to nutrition and physical activity/exercise program aimed toward attainment of established weight goal;Weight Loss: Understanding of general recommendations for a balanced deficit meal plan, which promotes 1-2 lb weight loss per week and includes a negative energy balance of [phone] kcal/d;Understanding recommendations for meals to include 15-35% energy as protein, 25-35% energy from fat, 35-60% energy from carbohydrates, less than 200mg  of dietary cholesterol, 20-35 gm of total fiber daily;Understanding of distribution of calorie intake throughout the day with the consumption of 4-5 meals/snacks    Improve shortness of breath with ADL's Yes    Intervention Provide education, individualized exercise plan and daily activity instruction to help decrease symptoms of SOB with activities of daily living.    Expected Outcomes Short Term: Improve cardiorespiratory fitness to achieve a reduction of symptoms when performing ADLs;Long Term: Be able to perform more ADLs without symptoms or delay the onset of symptoms    Diabetes Yes    Intervention Provide education about signs/symptoms and action to take for hypo/hyperglycemia.;Provide education about proper nutrition,  including hydration, and aerobic/resistive exercise prescription along with prescribed medications to achieve blood glucose in normal ranges: Fasting glucose 65-99 mg/dL    Expected Outcomes Short Term: Participant verbalizes understanding of the signs/symptoms and immediate care of hyper/hypoglycemia, proper foot care and importance of medication, aerobic/resistive exercise and nutrition plan for blood glucose control.;Long Term: Attainment of HbA1C < 7%.             Core Components/Risk Factors/Patient Goals Review:   Goals and Risk Factor Review     Row Name 03/18/21 0951 04/13/21 1015           Core Components/Risk Factors/Patient Goals Review   Personal Goals Review Weight Management/Obesity;Other;Improve shortness of breath with ADL's Weight Management/Obesity;Other;Improve shortness of breath with ADL's      Review Patient was referred to CR with [facility]. He has multiple risk factors for CAD and is participating in the program for risk modification. He has completed 3 sessions. His personal goals for the program are to reduce his SOB with activity. We will continue to monitor his progress  as he works towards meeting these goals. Patient has completed 11 sessions losing 4 lbs since last 30 day review. He is doing well in the program with consistent attendance and progressions. He blood pressure is well controlled. He is working full time without difficulty. His personal goals for the program continue to be to refuce SOB with activity. We will continue to monitor his progress as he works towards meeting these goals.      Expected Outcomes Patient will complete the program meeting both personal and program goals. Patient will complete the program meeting both personal and program goals.               Core Components/Risk Factors/Patient Goals at Discharge (Final Review):   Goals and Risk Factor Review - 04/13/21 1015       Core Components/Risk Factors/Patient Goals Review    Personal Goals Review Weight Management/Obesity;Other;Improve shortness of breath with ADL's    Review Patient has completed 11 sessions losing 4 lbs since last 30 day review. He is doing well in the program with consistent attendance and progressions. He blood pressure is well controlled. He is working full time without difficulty. His personal goals for the program continue to be to refuce SOB with activity. We will continue to monitor his progress as he works towards meeting these goals.    Expected Outcomes Patient will complete the program meeting both personal and program goals.             ITP Comments:   Comments: ITP REVIEW Pt is making expected progress toward Cardiac Rehab goals after completing 13 sessions. Recommend continued exercise, life style modification, education, and increased stamina and strength.

## 2021-04-24 ENCOUNTER — Encounter (HOSPITAL_COMMUNITY)
Admission: RE | Admit: 2021-04-24 | Discharge: 2021-04-24 | Disposition: A | Discharge status: Home / Self Care | Payer: 59 | Source: Ambulatory Visit | Attending: Cardiovascular Disease | Admitting: Cardiovascular Disease

## 2021-04-24 DIAGNOSIS — I2102 ST elevation (STEMI) myocardial infarction involving left anterior descending coronary artery: Secondary | ICD-10-CM

## 2021-04-24 DIAGNOSIS — Z951 Presence of aortocoronary bypass graft: Secondary | ICD-10-CM

## 2021-04-24 NOTE — Progress Notes (Signed)
Daily Session Note  Patient Details  Name: Allen Walton MRN: [id number] Date of Birth: [birth year] Referring Provider:   Flowsheet Row CARDIAC REHAB PHASE II ORIENTATION from 03/02/2021 in [place]  Referring Provider Dr. Cyndia Bent       Encounter Date: 04/24/2021  Check In:  Session Check In - 04/24/21 0815       Check-In   Supervising physician immediately available to respond to emergencies [facility] MD immediately available    Physician(s) Dr. Audie Box    Location [facility]    Staff Present Redge Gainer, BS, Exercise Physiologist;Debra Wynetta Emery, RN, BSN    Virtual Visit No    Medication changes reported     No    Fall or balance concerns reported    No    Tobacco Cessation No Change    Warm-up and Cool-down Performed as group-led instruction    Resistance Training Performed Yes    VAD Patient? No    PAD/SET Patient? No      Pain Assessment   Currently in Pain? No/denies    Multiple Pain Sites No             Capillary Blood Glucose: No results found for this or any previous visit (from the past 24 hour(s)).    Social History   Tobacco Use  Smoking Status Some Days   Types: Cigarettes  Smokeless Tobacco Never    Goals Met:  Independence with exercise equipment Exercise tolerated well No report of concerns or symptoms today Strength training completed today  Goals Unmet:  Not Applicable  Comments: check out 0915   Dr. Kathie Dike is Medical Director for [facility] Pulmonary Rehab.

## 2021-04-27 ENCOUNTER — Encounter (HOSPITAL_COMMUNITY)
Admission: RE | Admit: 2021-04-27 | Discharge: 2021-04-27 | Disposition: A | Discharge status: Home / Self Care | Payer: 59 | Source: Ambulatory Visit | Attending: Cardiovascular Disease | Admitting: Cardiovascular Disease

## 2021-04-27 DIAGNOSIS — Z951 Presence of aortocoronary bypass graft: Secondary | ICD-10-CM

## 2021-04-27 DIAGNOSIS — I2102 ST elevation (STEMI) myocardial infarction involving left anterior descending coronary artery: Secondary | ICD-10-CM

## 2021-04-27 NOTE — Progress Notes (Signed)
Daily Session Note  Patient Details  Name: Allen Walton MRN: [id number] Date of Birth: 1970-09-13 Referring Provider:   Flowsheet Row CARDIAC REHAB PHASE II ORIENTATION from 03/02/2021 in [place]  Referring Provider Dr. Cyndia Bent       Encounter Date: 04/27/2021  Check In:  Session Check In - 04/27/21 0815       Check-In   Supervising physician immediately available to respond to emergencies [facility] MD immediately available    Physician(s) Dr. Audie Box    Location [facility]    Staff Present Hoy Register, MS, ACSM-CEP, Exercise Physiologist;Debra Wynetta Emery, RN, BSN    Virtual Visit No    Medication changes reported     No    Fall or balance concerns reported    No    Tobacco Cessation No Change    Warm-up and Cool-down Performed as group-led instruction    Resistance Training Performed Yes    VAD Patient? No    PAD/SET Patient? No      Pain Assessment   Currently in Pain? No/denies    Multiple Pain Sites No             Capillary Blood Glucose: No results found for this or any previous visit (from the past 24 hour(s)).    Social History   Tobacco Use  Smoking Status Some Days   Types: Cigarettes  Smokeless Tobacco Never    Goals Met:  Independence with exercise equipment Exercise tolerated well No report of concerns or symptoms today Strength training completed today  Goals Unmet:  Not Applicable  Comments: checkout time is 0915   Dr. Kathie Dike is Medical Director for [facility] Pulmonary Rehab.

## 2021-05-01 ENCOUNTER — Other Ambulatory Visit: Payer: Self-pay

## 2021-05-01 ENCOUNTER — Encounter (HOSPITAL_COMMUNITY)
Admission: RE | Admit: 2021-05-01 | Discharge: 2021-05-01 | Disposition: A | Discharge status: Home / Self Care | Payer: 59 | Source: Ambulatory Visit | Attending: Cardiovascular Disease | Admitting: Cardiovascular Disease

## 2021-05-01 DIAGNOSIS — I2102 ST elevation (STEMI) myocardial infarction involving left anterior descending coronary artery: Secondary | ICD-10-CM

## 2021-05-01 DIAGNOSIS — Z951 Presence of aortocoronary bypass graft: Secondary | ICD-10-CM

## 2021-05-01 NOTE — Progress Notes (Signed)
Daily Session Note  Patient Details  Name: Allen Walton MRN: [id number] Date of Birth: 10-02-1970 Referring Provider:   Flowsheet Row CARDIAC REHAB PHASE II ORIENTATION from 03/02/2021 in [place]  Referring Provider Dr. Cyndia Bent       Encounter Date: 05/01/2021  Check In:  Session Check In - 05/01/21 0815       Check-In   Supervising physician immediately available to respond to emergencies [facility] MD immediately available    Physician(s) Dr. Domenic Polite    Location [facility]    Staff Present Geanie Cooley, RN;Heather Otho Ket, BS, Exercise Physiologist;Dalton Kris Mouton, MS, ACSM-CEP, Exercise Physiologist    Virtual Visit No    Medication changes reported     No    Fall or balance concerns reported    No    Tobacco Cessation No Change    Warm-up and Cool-down Performed as group-led instruction    Resistance Training Performed Yes    VAD Patient? No    PAD/SET Patient? No      Pain Assessment   Currently in Pain? No/denies    Multiple Pain Sites No             Capillary Blood Glucose: No results found for this or any previous visit (from the past 24 hour(s)).    Social History   Tobacco Use  Smoking Status Some Days   Types: Cigarettes  Smokeless Tobacco Never    Goals Met:  Independence with exercise equipment Exercise tolerated well No report of concerns or symptoms today Strength training completed today  Goals Unmet:  Not Applicable  Comments: check out @ 9:15am   Dr. Kathie Dike is Medical Director for [facility] Pulmonary Rehab.

## 2021-05-04 ENCOUNTER — Encounter (HOSPITAL_COMMUNITY)
Admission: RE | Admit: 2021-05-04 | Discharge: 2021-05-04 | Disposition: A | Discharge status: Home / Self Care | Payer: 59 | Source: Ambulatory Visit | Attending: Cardiovascular Disease | Admitting: Cardiovascular Disease

## 2021-05-04 VITALS — Wt 222.2 lb

## 2021-05-04 DIAGNOSIS — I2102 ST elevation (STEMI) myocardial infarction involving left anterior descending coronary artery: Secondary | ICD-10-CM

## 2021-05-04 DIAGNOSIS — Z951 Presence of aortocoronary bypass graft: Secondary | ICD-10-CM

## 2021-05-04 NOTE — Progress Notes (Signed)
Daily Session Note  Patient Details  Name: Allen Walton MRN: [id number] Date of Birth: Jan 03, 1971 Referring Provider:   Flowsheet Row CARDIAC REHAB PHASE II ORIENTATION from 03/02/2021 in [place]  Referring Provider Dr. Cyndia Bent       Encounter Date: 05/04/2021  Check In:  Session Check In - 05/04/21 0815       Check-In   Supervising physician immediately available to respond to emergencies [facility] MD immediately available    Physician(s) Dr. Harrington Challenger    Location [facility]    Staff Present Geanie Cooley, RN;Dalton Fletcher, MS, ACSM-CEP, Exercise Physiologist    Virtual Visit No    Medication changes reported     No    Fall or balance concerns reported    No    Tobacco Cessation No Change    Warm-up and Cool-down Performed as group-led instruction    Resistance Training Performed Yes    VAD Patient? No    PAD/SET Patient? No      Pain Assessment   Currently in Pain? No/denies    Multiple Pain Sites No             Capillary Blood Glucose: No results found for this or any previous visit (from the past 24 hour(s)).    Social History   Tobacco Use  Smoking Status Some Days   Types: Cigarettes  Smokeless Tobacco Never    Goals Met:  Independence with exercise equipment Exercise tolerated well No report of concerns or symptoms today Strength training completed today  Goals Unmet:  Not Applicable  Comments: check out @ 9:15am   Dr. Kathie Dike is Medical Director for [facility] Pulmonary Rehab.

## 2021-05-08 ENCOUNTER — Other Ambulatory Visit: Payer: Self-pay

## 2021-05-08 ENCOUNTER — Encounter (HOSPITAL_COMMUNITY)
Admission: RE | Admit: 2021-05-08 | Discharge: 2021-05-08 | Disposition: A | Discharge status: Home / Self Care | Payer: 59 | Source: Ambulatory Visit | Attending: Cardiovascular Disease | Admitting: Cardiovascular Disease

## 2021-05-08 DIAGNOSIS — I2102 ST elevation (STEMI) myocardial infarction involving left anterior descending coronary artery: Secondary | ICD-10-CM

## 2021-05-08 DIAGNOSIS — Z951 Presence of aortocoronary bypass graft: Secondary | ICD-10-CM

## 2021-05-08 NOTE — Progress Notes (Signed)
Daily Session Note  Patient Details  Name: Allen Walton MRN: [id number] Date of Birth: 28-Dec-1970 Referring Provider:   Flowsheet Row CARDIAC REHAB PHASE II ORIENTATION from 03/02/2021 in [place]  Referring Provider Dr. Cyndia Bent       Encounter Date: 05/08/2021  Check In:  Session Check In - 05/08/21 0815       Check-In   Supervising physician immediately available to respond to emergencies [facility] MD immediately available    Physician(s) Dr. Harl Bowie    Location [facility]    Staff Present Hoy Register, MS, ACSM-CEP, Exercise Physiologist;Other    Virtual Visit No    Medication changes reported     No    Fall or balance concerns reported    No    Tobacco Cessation No Change    Warm-up and Cool-down Performed as group-led instruction    Resistance Training Performed Yes    VAD Patient? No    PAD/SET Patient? No      Pain Assessment   Currently in Pain? No/denies    Multiple Pain Sites No             Capillary Blood Glucose: No results found for this or any previous visit (from the past 24 hour(s)).    Social History   Tobacco Use  Smoking Status Some Days   Types: Cigarettes  Smokeless Tobacco Never    Goals Met:  Independence with exercise equipment Exercise tolerated well No report of concerns or symptoms today Strength training completed today  Goals Unmet:  Not Applicable  Comments: checkout time is 0915   Dr. Kathie Dike is Medical Director for [facility] Pulmonary Rehab.

## 2021-05-11 ENCOUNTER — Other Ambulatory Visit: Payer: Self-pay

## 2021-05-11 ENCOUNTER — Encounter (HOSPITAL_COMMUNITY)
Admission: RE | Admit: 2021-05-11 | Discharge: 2021-05-11 | Disposition: A | Discharge status: Home / Self Care | Payer: 59 | Source: Ambulatory Visit | Attending: Cardiovascular Disease | Admitting: Cardiovascular Disease

## 2021-05-11 DIAGNOSIS — I2102 ST elevation (STEMI) myocardial infarction involving left anterior descending coronary artery: Secondary | ICD-10-CM

## 2021-05-11 DIAGNOSIS — Z951 Presence of aortocoronary bypass graft: Secondary | ICD-10-CM

## 2021-05-11 NOTE — Progress Notes (Signed)
Daily Session Note  Patient Details  Name: Allen Walton MRN: [id number] Date of Birth: [birth year] Referring Provider:   Flowsheet Row CARDIAC REHAB PHASE II ORIENTATION from 03/02/2021 in [place]  Referring Provider Dr. Cyndia Bent       Encounter Date: 05/11/2021  Check In:  Session Check In - 05/11/21 0815       Check-In   Supervising physician immediately available to respond to emergencies [facility] MD immediately available    Physician(s) Dr. Domenic Polite    Location [facility]    Staff Present Hoy Register, MS, ACSM-CEP, Exercise Physiologist;Debra Wynetta Emery, RN, BSN    Virtual Visit No    Medication changes reported     No    Fall or balance concerns reported    No    Tobacco Cessation No Change    Warm-up and Cool-down Performed as group-led instruction    Resistance Training Performed Yes    VAD Patient? No    PAD/SET Patient? No      Pain Assessment   Currently in Pain? No/denies    Multiple Pain Sites No             Capillary Blood Glucose: No results found for this or any previous visit (from the past 24 hour(s)).    Social History   Tobacco Use  Smoking Status Some Days   Types: Cigarettes  Smokeless Tobacco Never    Goals Met:  Independence with exercise equipment Exercise tolerated well No report of concerns or symptoms today Strength training completed today  Goals Unmet:  Not Applicable  Comments: checkout time is 0915   Dr. Kathie Dike is Medical Director for [facility] Pulmonary Rehab.

## 2021-05-12 DIAGNOSIS — I639 Cerebral infarction, unspecified: Secondary | ICD-10-CM

## 2021-05-12 HISTORY — DX: Cerebral infarction, unspecified: I63.9

## 2021-05-15 ENCOUNTER — Encounter (HOSPITAL_COMMUNITY)
Admission: RE | Admit: 2021-05-15 | Discharge: 2021-05-15 | Disposition: A | Discharge status: Home / Self Care | Payer: 59 | Source: Ambulatory Visit | Attending: Cardiovascular Disease | Admitting: Cardiovascular Disease

## 2021-05-15 DIAGNOSIS — Z951 Presence of aortocoronary bypass graft: Secondary | ICD-10-CM | POA: Diagnosis present

## 2021-05-15 DIAGNOSIS — I2102 ST elevation (STEMI) myocardial infarction involving left anterior descending coronary artery: Secondary | ICD-10-CM | POA: Diagnosis not present

## 2021-05-15 NOTE — Progress Notes (Signed)
Daily Session Note  Patient Details  Name: Allen Walton MRN: [id number] Date of Birth: [birth year] Referring Provider:   Flowsheet Row CARDIAC REHAB PHASE II ORIENTATION from 03/02/2021 in [place]  Referring Provider Dr. Cyndia Bent       Encounter Date: 05/15/2021  Check In:  Session Check In - 05/15/21 0815       Check-In   Supervising physician immediately available to respond to emergencies [facility] MD immediately available    Physician(s) Dr. Domenic Polite    Location [facility]    Staff Present Hoy Register, MS, ACSM-CEP, Exercise Physiologist;Debra Wynetta Emery, RN, BSN    Virtual Visit No    Medication changes reported     No    Fall or balance concerns reported    No    Tobacco Cessation No Change    Warm-up and Cool-down Performed as group-led instruction    Resistance Training Performed Yes    VAD Patient? No    PAD/SET Patient? No      Pain Assessment   Currently in Pain? No/denies    Multiple Pain Sites No             Capillary Blood Glucose: No results found for this or any previous visit (from the past 24 hour(s)).    Social History   Tobacco Use  Smoking Status Some Days   Types: Cigarettes  Smokeless Tobacco Never    Goals Met:  Independence with exercise equipment Exercise tolerated well No report of concerns or symptoms today Strength training completed today  Goals Unmet:  Not Applicable  Comments: checkout time is 0915   Dr. Kathie Dike is Medical Director for [facility] Pulmonary Rehab.

## 2021-05-18 ENCOUNTER — Encounter (HOSPITAL_COMMUNITY)
Admission: RE | Admit: 2021-05-18 | Discharge: 2021-05-18 | Disposition: A | Discharge status: Home / Self Care | Payer: 59 | Source: Ambulatory Visit | Attending: Cardiovascular Disease | Admitting: Cardiovascular Disease

## 2021-05-18 VITALS — Wt 220.9 lb

## 2021-05-18 DIAGNOSIS — I2102 ST elevation (STEMI) myocardial infarction involving left anterior descending coronary artery: Secondary | ICD-10-CM | POA: Diagnosis not present

## 2021-05-18 DIAGNOSIS — Z951 Presence of aortocoronary bypass graft: Secondary | ICD-10-CM

## 2021-05-18 NOTE — Progress Notes (Signed)
Daily Session Note  Patient Details  Name: Allen Walton MRN: [id number] Date of Birth: [birth year] Referring Provider:   Flowsheet Row CARDIAC REHAB PHASE II ORIENTATION from 03/02/2021 in [place]  Referring Provider Dr. Cyndia Bent       Encounter Date: 05/18/2021  Check In:  Session Check In - 05/18/21 0815       Check-In   Supervising physician immediately available to respond to emergencies [facility] MD immediately available    Physician(s) Dr. Gasper Sells    Location [facility]    Staff Present Hoy Register, MS, ACSM-CEP, Exercise Physiologist;Hadley Soileau Zigmund Daniel, Exercise Physiologist    Virtual Visit No    Medication changes reported     No    Fall or balance concerns reported    No    Tobacco Cessation No Change    Warm-up and Cool-down Performed as group-led instruction    Resistance Training Performed Yes    VAD Patient? No    PAD/SET Patient? No      Pain Assessment   Currently in Pain? No/denies    Multiple Pain Sites No             Capillary Blood Glucose: No results found for this or any previous visit (from the past 24 hour(s)).    Social History   Tobacco Use  Smoking Status Some Days   Types: Cigarettes  Smokeless Tobacco Never    Goals Met:  Independence with exercise equipment Exercise tolerated well No report of concerns or symptoms today Strength training completed today  Goals Unmet:  Not Applicable  Comments: check out 0915   Dr. Kathie Dike is Medical Director for [facility] Pulmonary Rehab.

## 2021-05-20 NOTE — Progress Notes (Signed)
Cardiac Individual Treatment Plan  Patient Details  Name: Allen Walton MRN: [id number] Date of Birth: 11/05/1970 Referring Provider:   Flowsheet Row CARDIAC REHAB PHASE II ORIENTATION from 03/02/2021 in [facility]  Referring Provider Dr. Cyndia Bent       Initial Encounter Date:  Flowsheet Row CARDIAC REHAB PHASE II ORIENTATION from 03/02/2021 in [facility]  Date 03/02/21       Visit Diagnosis: ST elevation myocardial infarction involving left anterior descending (LAD) coronary artery (HCC)  S/P CABG x 1  Patient's Home Medications on Admission:  Current Outpatient Medications:    amiodarone (PACERONE) 200 MG tablet, Take 2 tablets (400 mg total) by mouth 2 (two) times daily for 5 days then reduce the dose to 1 tablet (200mg ) by mouth twice daily., Disp: 60 tablet, Rfl: 1   aspirin 81 MG EC tablet, Take 1 tablet (81 mg total) by mouth daily. Swallow whole., Disp: 30 tablet, Rfl: 11   atorvastatin (LIPITOR) 80 MG tablet, Take 1 tablet (80 mg total) by mouth daily., Disp: 30 tablet, Rfl: 2   clopidogrel (PLAVIX) 75 MG tablet, Take 1 tablet (75 mg total) by mouth daily., Disp: 30 tablet, Rfl: 11   glipiZIDE (GLUCOTROL) 5 MG tablet, Take 2.5 mg by mouth 2 (two) times daily before a meal., Disp: , Rfl:    metFORMIN (GLUCOPHAGE) 1000 MG tablet, Take 1,000 mg by mouth 2 (two) times daily with a meal., Disp: , Rfl:    metoprolol tartrate (LOPRESSOR) 25 MG tablet, Take 1 tablet (25 mg total) by mouth 2 (two) times daily., Disp: 60 tablet, Rfl: 2   TechLite Lancets MISC, Inject 1 applicator into the skin daily as needed., Disp: , Rfl:   Past Medical History: Past Medical History:  Diagnosis Date   Anginal pain ([place])    Diabetes mellitus without complication ([place])    Myocardial infarction ([place])     Tobacco Use: Social History   Tobacco Use  Smoking Status Some Days   Types: Cigarettes  Smokeless Tobacco Never    Labs: Recent Review  Flowsheet Data     Labs for ITP Cardiac and Pulmonary Rehab Latest Ref Rng & Units 11/29/2020 11/29/2020 11/29/2020 11/30/2020 12/02/2020   Cholestrol 0 - 200 mg/dL - - - - 107   LDLCALC 0 - 99 mg/dL - - - - 63   HDL >40 mg/dL - - - - 16(L)   Trlycerides <150 mg/dL - - - - 139   Hemoglobin A1c 4.8 - 5.6 % - - - - -   PHART 7.350 - 7.450 7.328(L) 7.331(L) 7.328(L) 7.309(L) -   PCO2ART 32.0 - 48.0 mmHg 46.0 49.7(H) 45.0 45.6 -   HCO3 20.0 - 28.0 mmol/L 24.2 26.4 23.6 22.8 -   TCO2 22 - 32 mmol/L 26 28 25 24  -   ACIDBASEDEF 0.0 - 2.0 mmol/L 2.0 - 2.0 3.0(H) -   O2SAT % 94.0 99.0 99.0 97.0 -       Capillary Blood Glucose: Lab Results  Component Value Date   GLUCAP 114 (H) 03/06/2021   GLUCAP 137 (H) 03/02/2021   GLUCAP 130 (H) 12/04/2020   GLUCAP 137 (H) 12/03/2020   GLUCAP 139 (H) 12/03/2020     Exercise Target Goals: Exercise Program Goal: Individual exercise prescription set using results from initial 6 min walk test and THRR while considering  patient's activity barriers and safety.   Exercise Prescription Goal: Starting with aerobic activity 30 plus minutes a day, 3 days per  week for initial exercise prescription. Provide home exercise prescription and guidelines that participant acknowledges understanding prior to discharge.  Activity Barriers & Risk Stratification:  Activity Barriers & Cardiac Risk Stratification - 03/02/21 1251       Activity Barriers & Cardiac Risk Stratification   Activity Barriers Arthritis    Cardiac Risk Stratification High             6 Minute Walk:  6 Minute Walk     Row Name 03/02/21 1410         6 Minute Walk   Phase Initial     Distance 1900 feet     Walk Time 6 minutes     # of Rest Breaks 0     MPH 3.6     METS 4.91     RPE 11     VO2 Peak 17.17     Symptoms No     Resting HR 82 bpm     Resting BP 124/56     Resting Oxygen Saturation  98 %     Exercise Oxygen Saturation  during 6 min walk 100 %     Max Ex. HR 103 bpm      Max Ex. BP 134/50     2 Minute Post BP 112/54              Oxygen Initial Assessment:   Oxygen Re-Evaluation:   Oxygen Discharge (Final Oxygen Re-Evaluation):   Initial Exercise Prescription:  Initial Exercise Prescription - 03/02/21 1400       Date of Initial Exercise RX and Referring Provider   Date 03/02/21    Referring Provider Dr. Cyndia Bent    Expected Discharge Date 05/25/21      Treadmill   MPH 3    Grade 0    Minutes 17      Recumbant Elliptical   Level 1    RPM 60    Minutes 22      Prescription Details   Frequency (times per week) 3    Duration Progress to 30 minutes of continuous aerobic without signs/symptoms of physical distress      Intensity   THRR 40-80% of Max Heartrate 68-135    Ratings of Perceived Exertion 11-13    Perceived Dyspnea 0-4      Resistance Training   Training Prescription Yes    Weight 5 lbs    Reps 10-15             Perform Capillary Blood Glucose checks as needed.  Exercise Prescription Changes:   Exercise Prescription Changes     Row Name 03/09/21 0815 03/23/21 0815 04/03/21 0800 04/06/21 0900 04/20/21 1200     Response to Exercise   Blood Pressure (Admit) 120/58 112/60 -- 125/60 110/62   Blood Pressure (Exercise) 138/68 132/60 -- 168/60 138/60   Blood Pressure (Exit) 110/58 108/58 -- 130/60 106/70   Heart Rate (Admit) 70 bpm 76 bpm -- 99 bpm 72 bpm   Heart Rate (Exercise) 90 bpm 105 bpm -- 132 bpm 102 bpm   Heart Rate (Exit) 73 bpm 84 bpm -- 105 bpm 81 bpm   Rating of Perceived Exertion (Exercise) 12 12 -- -- 12   Duration Continue with 30 min of aerobic exercise without signs/symptoms of physical distress. Continue with 30 min of aerobic exercise without signs/symptoms of physical distress. -- Continue with 30 min of aerobic exercise without signs/symptoms of physical distress. Continue with 30 min of aerobic exercise without signs/symptoms of physical  distress.   Intensity THRR unchanged THRR unchanged --  THRR unchanged THRR unchanged     Progression   Progression Continue to progress workloads to maintain intensity without signs/symptoms of physical distress. Continue to progress workloads to maintain intensity without signs/symptoms of physical distress. -- Continue to progress workloads to maintain intensity without signs/symptoms of physical distress. Continue to progress workloads to maintain intensity without signs/symptoms of physical distress.     Resistance Training   Training Prescription Yes Yes -- Yes Yes   Weight 5 lbs 5 lbs -- 8 5   Reps 10-15 10-15 -- 10-15 10-15   Time 10 Minutes 10 Minutes -- 10 Minutes 10 Minutes     Treadmill   MPH 3 3.1 -- 3.1 3   Grade 0 0 -- 2.5 2.5   Minutes 17 17 -- 17 17   METs 3.3 3.37 -- 4.44 4.33     Recumbant Elliptical   Level 1 3 -- 3 4   RPM 56 58 -- 58 67   Minutes 22 22 -- 22 22   METs 3.9 3 -- 2.8 3.9     Home Exercise Plan   Plans to continue exercise at -- -- Home (comment) -- --   Frequency -- -- Add 3 additional days to program exercise sessions. -- --   Initial Home Exercises Provided -- -- 04/03/21 -- --    Central Name 05/04/21 1200 05/18/21 1000           Response to Exercise   Blood Pressure (Admit) 112/64 100/68      Blood Pressure (Exercise) 140/68 140/60      Blood Pressure (Exit) 108/60 110/60      Heart Rate (Admit) 75 bpm 81 bpm      Heart Rate (Exercise) 116 bpm 100 bpm      Heart Rate (Exit) 84 bpm 85 bpm      Rating of Perceived Exertion (Exercise) 13 12      Duration Continue with 30 min of aerobic exercise without signs/symptoms of physical distress. Continue with 30 min of aerobic exercise without signs/symptoms of physical distress.      Intensity THRR unchanged THRR unchanged        Progression   Progression Continue to progress workloads to maintain intensity without signs/symptoms of physical distress. Continue to progress workloads to maintain intensity without signs/symptoms of physical distress.         Resistance Training   Training Prescription Yes Yes      Weight 5 lbs 5      Reps 10-15 10-15      Time 10 Minutes 10 Minutes        Treadmill   MPH 3.5 3.1      Grade 2.5 2.5      Minutes 17 17      METs 4.89 4.44        Recumbant Elliptical   Level 5 5      RPM 69 55      Minutes 22 22      METs 4.1 3.7               Exercise Comments:   Exercise Comments     Row Name 04/03/21 [id number]           Exercise Comments home exercise reviewed                Exercise Goals and Review:   Exercise Goals     Row Name 03/02/21  1413 03/24/21 0910 04/20/21 1247 05/18/21 1029       Exercise Goals   Increase Physical Activity Yes Yes Yes Yes    Intervention Provide advice, education, support and counseling about physical activity/exercise needs.;Develop an individualized exercise prescription for aerobic and resistive training based on initial evaluation findings, risk stratification, comorbidities and participant's personal goals. Provide advice, education, support and counseling about physical activity/exercise needs.;Develop an individualized exercise prescription for aerobic and resistive training based on initial evaluation findings, risk stratification, comorbidities and participant's personal goals. Provide advice, education, support and counseling about physical activity/exercise needs.;Develop an individualized exercise prescription for aerobic and resistive training based on initial evaluation findings, risk stratification, comorbidities and participant's personal goals. Provide advice, education, support and counseling about physical activity/exercise needs.;Develop an individualized exercise prescription for aerobic and resistive training based on initial evaluation findings, risk stratification, comorbidities and participant's personal goals.    Expected Outcomes Short Term: Attend rehab on a regular basis to increase amount of physical activity.;Long Term: Add in  home exercise to make exercise part of routine and to increase amount of physical activity.;Long Term: Exercising regularly at least 3-5 days a week. Short Term: Attend rehab on a regular basis to increase amount of physical activity.;Long Term: Add in home exercise to make exercise part of routine and to increase amount of physical activity.;Long Term: Exercising regularly at least 3-5 days a week. Short Term: Attend rehab on a regular basis to increase amount of physical activity.;Long Term: Add in home exercise to make exercise part of routine and to increase amount of physical activity.;Long Term: Exercising regularly at least 3-5 days a week. Short Term: Attend rehab on a regular basis to increase amount of physical activity.;Long Term: Add in home exercise to make exercise part of routine and to increase amount of physical activity.;Long Term: Exercising regularly at least 3-5 days a week.    Increase Strength and Stamina Yes Yes Yes Yes    Intervention Provide advice, education, support and counseling about physical activity/exercise needs.;Develop an individualized exercise prescription for aerobic and resistive training based on initial evaluation findings, risk stratification, comorbidities and participant's personal goals. Provide advice, education, support and counseling about physical activity/exercise needs.;Develop an individualized exercise prescription for aerobic and resistive training based on initial evaluation findings, risk stratification, comorbidities and participant's personal goals. Provide advice, education, support and counseling about physical activity/exercise needs.;Develop an individualized exercise prescription for aerobic and resistive training based on initial evaluation findings, risk stratification, comorbidities and participant's personal goals. Provide advice, education, support and counseling about physical activity/exercise needs.;Develop an individualized exercise  prescription for aerobic and resistive training based on initial evaluation findings, risk stratification, comorbidities and participant's personal goals.    Expected Outcomes Short Term: Increase workloads from initial exercise prescription for resistance, speed, and METs.;Short Term: Perform resistance training exercises routinely during rehab and add in resistance training at home;Long Term: Improve cardiorespiratory fitness, muscular endurance and strength as measured by increased METs and functional capacity (6MWT) Short Term: Increase workloads from initial exercise prescription for resistance, speed, and METs.;Short Term: Perform resistance training exercises routinely during rehab and add in resistance training at home;Long Term: Improve cardiorespiratory fitness, muscular endurance and strength as measured by increased METs and functional capacity (6MWT) Short Term: Increase workloads from initial exercise prescription for resistance, speed, and METs.;Short Term: Perform resistance training exercises routinely during rehab and add in resistance training at home;Long Term: Improve cardiorespiratory fitness, muscular endurance and strength as measured by increased METs and functional  capacity (6MWT) Short Term: Increase workloads from initial exercise prescription for resistance, speed, and METs.;Short Term: Perform resistance training exercises routinely during rehab and add in resistance training at home;Long Term: Improve cardiorespiratory fitness, muscular endurance and strength as measured by increased METs and functional capacity (6MWT)    Able to understand and use rate of perceived exertion (RPE) scale Yes Yes Yes Yes    Intervention Provide education and explanation on how to use RPE scale Provide education and explanation on how to use RPE scale Provide education and explanation on how to use RPE scale Provide education and explanation on how to use RPE scale    Expected Outcomes Short Term:  Able to use RPE daily in rehab to express subjective intensity level;Long Term:  Able to use RPE to guide intensity level when exercising independently Short Term: Able to use RPE daily in rehab to express subjective intensity level;Long Term:  Able to use RPE to guide intensity level when exercising independently Short Term: Able to use RPE daily in rehab to express subjective intensity level;Long Term:  Able to use RPE to guide intensity level when exercising independently Short Term: Able to use RPE daily in rehab to express subjective intensity level;Long Term:  Able to use RPE to guide intensity level when exercising independently    Knowledge and understanding of Target Heart Rate Range (THRR) Yes Yes Yes Yes    Intervention Provide education and explanation of THRR including how the numbers were predicted and where they are located for reference Provide education and explanation of THRR including how the numbers were predicted and where they are located for reference Provide education and explanation of THRR including how the numbers were predicted and where they are located for reference Provide education and explanation of THRR including how the numbers were predicted and where they are located for reference    Expected Outcomes Short Term: Able to state/look up THRR;Long Term: Able to use THRR to govern intensity when exercising independently;Short Term: Able to use daily as guideline for intensity in rehab Short Term: Able to state/look up THRR;Long Term: Able to use THRR to govern intensity when exercising independently;Short Term: Able to use daily as guideline for intensity in rehab Short Term: Able to state/look up THRR;Long Term: Able to use THRR to govern intensity when exercising independently;Short Term: Able to use daily as guideline for intensity in rehab Short Term: Able to state/look up THRR;Long Term: Able to use THRR to govern intensity when exercising independently;Short Term: Able to  use daily as guideline for intensity in rehab    Able to check pulse independently Yes Yes Yes Yes    Intervention Provide education and demonstration on how to check pulse in carotid and radial arteries.;Review the importance of being able to check your own pulse for safety during independent exercise Provide education and demonstration on how to check pulse in carotid and radial arteries.;Review the importance of being able to check your own pulse for safety during independent exercise Provide education and demonstration on how to check pulse in carotid and radial arteries.;Review the importance of being able to check your own pulse for safety during independent exercise Provide education and demonstration on how to check pulse in carotid and radial arteries.;Review the importance of being able to check your own pulse for safety during independent exercise    Expected Outcomes Short Term: Able to explain why pulse checking is important during independent exercise;Long Term: Able to check pulse independently and accurately Short  Term: Able to explain why pulse checking is important during independent exercise;Long Term: Able to check pulse independently and accurately Short Term: Able to explain why pulse checking is important during independent exercise;Long Term: Able to check pulse independently and accurately Short Term: Able to explain why pulse checking is important during independent exercise;Long Term: Able to check pulse independently and accurately    Understanding of Exercise Prescription Yes Yes Yes Yes    Intervention Provide education, explanation, and written materials on patient's individual exercise prescription Provide education, explanation, and written materials on patient's individual exercise prescription Provide education, explanation, and written materials on patient's individual exercise prescription Provide education, explanation, and written materials on patient's individual exercise  prescription    Expected Outcomes Short Term: Able to explain program exercise prescription;Long Term: Able to explain home exercise prescription to exercise independently Short Term: Able to explain program exercise prescription;Long Term: Able to explain home exercise prescription to exercise independently Short Term: Able to explain program exercise prescription;Long Term: Able to explain home exercise prescription to exercise independently Short Term: Able to explain program exercise prescription;Long Term: Able to explain home exercise prescription to exercise independently             Exercise Goals Re-Evaluation :  Exercise Goals Re-Evaluation     Row Name 03/24/21 0910 04/20/21 1247 05/18/21 1030         Exercise Goal Re-Evaluation   Exercise Goals Review Increase Physical Activity;Increase Strength and Stamina;Able to understand and use rate of perceived exertion (RPE) scale;Knowledge and understanding of Target Heart Rate Range (THRR);Able to check pulse independently;Understanding of Exercise Prescription Increase Physical Activity;Increase Strength and Stamina;Able to understand and use rate of perceived exertion (RPE) scale;Knowledge and understanding of Target Heart Rate Range (THRR);Able to check pulse independently;Understanding of Exercise Prescription Increase Physical Activity;Increase Strength and Stamina;Able to understand and use rate of perceived exertion (RPE) scale;Knowledge and understanding of Target Heart Rate Range (THRR);Able to check pulse independently;Understanding of Exercise Prescription     Comments Pt has completed 5 sessions of cardiac rehab. He missed several sessions due to having COVID but has since returned with no problems. He was fit at his baseline and should progress well in the program. He is currently exercising at 3.0 METs on the elliptical. Will continue to monitor and progress as able. Pt has completed 13 session of cardiac rehab. He continues to  progress while in the program by increasing his work load and exercising at home. He is currently at a work load of 4.33 METs on the TM. Pt has completed 21 sessions of cardiac rehad. He continues to show progressing in the program by increasing worl load and also exercising at home. He is currently exercsing at a work load of 4.44 METs on the TM.     Expected Outcomes Through exercise at rehab and at home, the patient will meet their stated goals. Through exercise at rehab and at home, the patient will meet their stated goals. Through exercise at rehab and at home, the patient will meet their stated goals.               Discharge Exercise Prescription (Final Exercise Prescription Changes):  Exercise Prescription Changes - 05/18/21 1000       Response to Exercise   Blood Pressure (Admit) 100/68    Blood Pressure (Exercise) 140/60    Blood Pressure (Exit) 110/60    Heart Rate (Admit) 81 bpm    Heart Rate (Exercise) 100 bpm  Heart Rate (Exit) 85 bpm    Rating of Perceived Exertion (Exercise) 12    Duration Continue with 30 min of aerobic exercise without signs/symptoms of physical distress.    Intensity THRR unchanged      Progression   Progression Continue to progress workloads to maintain intensity without signs/symptoms of physical distress.      Resistance Training   Training Prescription Yes    Weight 5    Reps 10-15    Time 10 Minutes      Treadmill   MPH 3.1    Grade 2.5    Minutes 17    METs 4.44      Recumbant Elliptical   Level 5    RPM 55    Minutes 22    METs 3.7             Nutrition:  Target Goals: Understanding of nutrition guidelines, daily intake of sodium 1500mg , cholesterol 200mg , calories 30% from fat and 7% or less from saturated fats, daily to have 5 or more servings of fruits and vegetables.  Biometrics:  Pre Biometrics - 03/02/21 1414       Pre Biometrics   Height 6\' 1"  (1.854 m)    Weight 105.7 kg    Waist Circumference 41.5  inches    Hip Circumference 44 inches    Waist to Hip Ratio 0.94 %    BMI (Calculated) 30.75    Triceps Skinfold 10 mm    % Body Fat 26.6 %    Grip Strength 37.6 kg    Flexibility 0 in    Single Leg Stand 46 seconds              Nutrition Therapy Plan and Nutrition Goals:   Nutrition Assessments:  Nutrition Assessments - 03/02/21 1254       MEDFICTS Scores   Pre Score 3            MEDIFICTS Score Key: ?70 Need to make dietary changes  40-70 Heart Healthy Diet ? 40 Therapeutic Level Cholesterol Diet   Picture Your Plate Scores: [id number] Unhealthy dietary pattern with much room for improvement. 41-50 Dietary pattern unlikely to meet recommendations for good health and room for improvement. 51-60 More healthful dietary pattern, with some room for improvement.  >60 Healthy dietary pattern, although there may be some specific behaviors that could be improved.    Nutrition Goals Re-Evaluation:   Nutrition Goals Discharge (Final Nutrition Goals Re-Evaluation):   Psychosocial: Target Goals: Acknowledge presence or absence of significant depression and/or stress, maximize coping skills, provide positive support system. Participant is able to verbalize types and ability to use techniques and skills needed for reducing stress and depression.  Initial Review & Psychosocial Screening:  Initial Psych Review & Screening - 03/02/21 1253       Initial Review   Current issues with Current Sleep Concerns      Family Dynamics   Good Support System? Yes    Comments His main support system is his wife.      Barriers   Psychosocial barriers to participate in program There are no identifiable barriers or psychosocial needs.;The patient should benefit from training in stress management and relaxation.      Screening Interventions   Interventions Encouraged to exercise    Expected Outcomes Long Term goal: The participant improves quality of Life and PHQ9 Scores as seen by post  scores and/or verbalization of changes;Short Term goal: Identification and review with participant of any Quality  of Life or Depression concerns found by scoring the questionnaire.             Quality of Life Scores:  Quality of Life - 03/02/21 1458       Quality of Life   Select Quality of Life      Quality of Life Scores   Health/Function Pre 21.2 %    Socioeconomic Pre 22.79 %    Psych/Spiritual Pre 25.29 %    Family Pre 24.6 %    GLOBAL Pre 22.87 %            Scores of 19 and below usually indicate a poorer quality of life in these areas.  A difference of  2-3 points is a clinically meaningful difference.  A difference of 2-3 points in the total score of the Quality of Life Index has been associated with significant improvement in overall quality of life, self-image, physical symptoms, and general health in studies assessing change in quality of life.  PHQ-9: Recent Review Flowsheet Data     Dep[facility] 03/02/2021   Decreased Interest 1   Down, Depressed, Hopeless 1   PHQ - 2 Score 2   Altered sleeping 1   Tired, decreased energy 0   Change in appetite 1   Feeling bad or failure about yourself  0   Trouble concentrating 1   Moving slowly or fidgety/restless 0   Suicidal thoughts 0   PHQ-9 Score 5   Difficult doing work/chores Somewhat difficult      Interpretation of Total Score  Total Score Depression Severity:  1-4 = Minimal depression, 5-9 = Mild depression, 10-14 = Moderate depression, 15-19 = Moderately severe depression, 20-27 = Severe depression   Psychosocial Evaluation and Intervention:  Psychosocial Evaluation - 03/02/21 1414       Psychosocial Evaluation & Interventions   Interventions Stress management education;Relaxation education;Encouraged to exercise with the program and follow exercise prescription    Comments Pt's only barrier to completing rehab could be work. He is going to do the program twice per week in order to reduce  the amount of time that he misses at work. He will end up clocking into work an hour late on the days he comes to rehab, but he states that his employer will let him stay an hour later to make up that hour. Pt does report that he does not get enough sleep. 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 Other than that, he has no identifiable psychosocial issues. He scored a 5 on his PHQ-9 and reports that he copes well on his own. His main support system is his wife. His goal for the program is to reduce his SOB with activity.    Expected Outcomes Pt will continue to not have any identifiable psychosocial issues.    Continue Psychosocial Services  No Follow up required             Psychosocial Re-Evaluation:  Psychosocial Re-Evaluation     Row Name 03/18/21 0948 04/13/21 1012 05/11/21 1258         Psychosocial Re-Evaluation   Current issues with Current Sleep Concerns  PTSD Current Sleep Concerns  PTSD Current Sleep Concerns     Comments Patient is new to the program completing 3 sessions. He continues to have no psychosocial barriers identified. He does have trouble sleeping at times as well as PTSD from serving in the army but is managing this  well. We will continue to monitor. Patient has completed 11  sessions. He continues to have no psychosocial barriers identified. 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 He seems to enjoy coming to the program and demonstrates an interest in improving his health. We will continue to monitor. Patient has completed 19  sessions. He continues to have no psychosocial barriers identified. 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 He seems to enjoy coming to the program and demonstrates an interest in improving his health. We will continue to monitor.     Expected Outcomes  Patient will continue to have no psychosocial barriers identified. Patient will continue to have no psychosocial barriers identified. Patient will continue to have no psychosocial barriers identified.     Interventions Stress management education;Encouraged to attend Cardiac Rehabilitation for the exercise;Relaxation education Stress management education;Encouraged to attend Cardiac Rehabilitation for the exercise;Relaxation education Stress management education;Encouraged to attend Cardiac Rehabilitation for the exercise;Relaxation education     Continue Psychosocial Services  No Follow up required No Follow up required No Follow up required              Psychosocial Discharge (Final Psychosocial Re-Evaluation):  Psychosocial Re-Evaluation - 05/11/21 1258       Psychosocial Re-Evaluation   Current issues with Current Sleep Concerns    Comments Patient has completed 19  sessions. He continues to have no psychosocial barriers identified. 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 He seems to enjoy coming to the program and demonstrates an interest in improving his health. We will continue to monitor.    Expected Outcomes Patient will continue to have no psychosocial barriers identified.    Interventions Stress management education;Encouraged to attend Cardiac Rehabilitation for the exercise;Relaxation education    Continue Psychosocial Services  No Follow up required             Vocational Rehabilitation: Provide vocational rehab assistance to qualifying candidates.   Vocational Rehab Evaluation & Intervention:  Vocational Rehab - 03/02/21 1300       Initial Vocational Rehab Evaluation & Intervention   Assessment shows need for Vocational Rehabilitation No             Education: Education Goals: Education classes will be provided on a weekly basis, covering required topics. Participant will state  understanding/return demonstration of topics presented.  Learning Barriers/Preferences:  Learning Barriers/Preferences - 03/02/21 1256       Learning Barriers/Preferences   Learning Barriers None    Learning Preferences Skilled Demonstration             Education Topics: Hypertension, Hypertension Reduction -Define heart disease and high blood pressure. Discus how high blood pressure affects the body and ways to reduce high blood pressure.   Exercise and Your Heart -Discuss why it is important to exercise, the FITT principles of exercise, normal and abnormal responses to exercise, and how to exercise safely.   Angina -Discuss definition of angina, causes of angina, treatment of angina, and how to decrease risk of having angina.   Cardiac Medications -Review what the following cardiac medications are used for, how they affect the body, and side effects that may occur when taking the medications.  Medications include Aspirin, Beta blockers, calcium channel blockers, ACE  Inhibitors, angiotensin receptor blockers, diuretics, digoxin, and antihyperlipidemics.   Congestive Heart Failure -Discuss the definition of CHF, how to live with CHF, the signs and symptoms of CHF, and how keep track of weight and sodium intake.   Heart Disease and Intimacy -Discus the effect sexual activity has on the heart, how changes occur during intimacy as we age, and safety during sexual activity.   Smoking Cessation / COPD -Discuss different methods to quit smoking, the health benefits of quitting smoking, and the definition of COPD.   Nutrition I: Fats -Discuss the types of cholesterol, what cholesterol does to the heart, and how cholesterol levels can be controlled.   Nutrition II: Labels -Discuss the different components of food labels and how to read food label   Heart Parts/Heart Disease and PAD -Discuss the anatomy of the heart, the pathway of blood circulation through the heart, and  these are affected by heart disease.   Stress I: Signs and Symptoms -Discuss the causes of stress, how stress may lead to anxiety and depression, and ways to limit stress.   Stress II: Relaxation -Discuss different types of relaxation techniques to limit stress.   Warning Signs of Stroke / TIA -Discuss definition of a stroke, what the signs and symptoms are of a stroke, and how to identify when someone is having stroke.   Knowledge Questionnaire Score:  Knowledge Questionnaire Score - 03/02/21 1256       Knowledge Questionnaire Score   Pre Score 17/24             Core Components/Risk Factors/Patient Goals at Admission:  Personal Goals and Risk Factors at Admission - 03/02/21 1300       Core Components/Risk Factors/Patient Goals on Admission    Weight Management Yes;Weight Loss    Intervention Weight Management: Develop a combined nutrition and exercise program designed to reach desired caloric intake, while maintaining appropriate intake of nutrient and fiber, sodium and fats, and appropriate energy expenditure required for the weight goal.;Weight Management: Provide education and appropriate resources to help participant work on and attain dietary goals.    Admit Weight 229 lb (103.9 kg)    Goal Weight: Short Term 220 lb (99.8 kg)    Expected Outcomes Short Term: Continue to assess and modify interventions until short term weight is achieved;Long Term: Adherence to nutrition and physical activity/exercise program aimed toward attainment of established weight goal;Weight Loss: Understanding of general recommendations for a balanced deficit meal plan, which promotes 1-2 lb weight loss per week and includes a negative energy balance of [phone] kcal/d;Understanding recommendations for meals to include 15-35% energy as protein, 25-35% energy from fat, 35-60% energy from carbohydrates, less than 200mg  of dietary cholesterol, 20-35 gm of total fiber daily;Understanding of distribution  of calorie intake throughout the day with the consumption of 4-5 meals/snacks    Improve shortness of breath with ADL's Yes    Intervention Provide education, individualized exercise plan and daily activity instruction to help decrease symptoms of SOB with activities of daily living.    Expected Outcomes Short Term: Improve cardiorespiratory fitness to achieve a reduction of symptoms when performing ADLs;Long Term: Be able to perform more ADLs without symptoms or delay the onset of symptoms    Diabetes Yes    Intervention Provide education about signs/symptoms and action to take for hypo/hyperglycemia.;Provide education about proper nutrition, including hydration, and aerobic/resistive exercise prescription along with prescribed medications to achieve blood glucose in normal ranges: Fasting glucose 65-99 mg/dL    Expected Outcomes  Short Term: Participant verbalizes understanding of the signs/symptoms and immediate care of hyper/hypoglycemia, proper foot care and importance of medication, aerobic/resistive exercise and nutrition plan for blood glucose control.;Long Term: Attainment of HbA1C < 7%.             Core Components/Risk Factors/Patient Goals Review:   Goals and Risk Factor Review     Row Name 03/18/21 0951 04/13/21 1015 05/11/21 1258         Core Components/Risk Factors/Patient Goals Review   Personal Goals Review Weight Management/Obesity;Other;Improve shortness of breath with ADL's Weight Management/Obesity;Other;Improve shortness of breath with ADL's Weight Management/Obesity;Other;Improve shortness of breath with ADL's     Review Patient was referred to CR with [facility]. He has multiple risk factors for CAD and is participating in the program for risk modification. He has completed 3 sessions. His personal goals for the program are to reduce his SOB with activity. We will continue to monitor his progress as he works towards meeting these goals. Patient has completed 11 sessions  losing 4 lbs since last 30 day review. He is doing well in the program with consistent attendance and progressions. He blood pressure is well controlled. He is working full time without difficulty. His personal goals for the program continue to be to refuce SOB with activity. We will continue to monitor his progress as he works towards meeting these goals. Patient has completed 19 sessions losing 5 lbs since last 30 day review. He continue to do well in the program with consistent attendance and progressions. He blood pressure continues to be well controlled. He is working full time without difficulty. His personal goals for the program continue to be to refuce SOB with activity. We will continue to monitor his progress as he works towards meeting these goals.     Expected Outcomes Patient will complete the program meeting both personal and program goals. Patient will complete the program meeting both personal and program goals. Patient will complete the program meeting both personal and program goals.              Core Components/Risk Factors/Patient Goals at Discharge (Final Review):   Goals and Risk Factor Review - 05/11/21 1258       Core Components/Risk Factors/Patient Goals Review   Personal Goals Review Weight Management/Obesity;Other;Improve shortness of breath with ADL's    Review Patient has completed 19 sessions losing 5 lbs since last 30 day review. He continue to do well in the program with consistent attendance and progressions. He blood pressure continues to be well controlled. He is working full time without difficulty. His personal goals for the program continue to be to refuce SOB with activity. We will continue to monitor his progress as he works towards meeting these goals.    Expected Outcomes Patient will complete the program meeting both personal and program goals.             ITP Comments:   Comments: ITP REVIEW Pt is making expected progress toward Cardiac Rehab  goals after completing 21 sessions. Recommend continued exercise, life style modification, education, and increased stamina and strength.

## 2021-05-22 ENCOUNTER — Encounter (HOSPITAL_COMMUNITY): Payer: 59

## 2021-05-25 ENCOUNTER — Encounter (HOSPITAL_COMMUNITY): Payer: 59

## 2021-05-27 NOTE — Progress Notes (Signed)
Discharge Progress Report  Patient Details  Name: Allen Walton MRN: [id number] Date of Birth: 25-Jun-1971 Referring Provider:   Flowsheet Row CARDIAC REHAB PHASE II ORIENTATION from 03/02/2021 in [place]  Referring Provider Dr. Cyndia Bent        Number of Visits: 21  Reason for Discharge:  Early Exit:  Diagnosed with a stoke on 05/18/2021  Smoking History:  Social History   Tobacco Use  Smoking Status Some Days   Types: Cigarettes  Smokeless Tobacco Never    Diagnosis:  ST elevation myocardial infarction involving left anterior descending (LAD) coronary artery (HCC)  S/P CABG x 1  ADL [facility]:   Initial Exercise Prescription:  Initial Exercise Prescription - 03/02/21 1400       Date of Initial Exercise RX and Referring Provider   Date 03/02/21    Referring Provider Dr. Cyndia Bent    Expected Discharge Date 05/25/21      Treadmill   MPH 3    Grade 0    Minutes 17      Recumbant Elliptical   Level 1    RPM 60    Minutes 22      Prescription Details   Frequency (times per week) 3    Duration Progress to 30 minutes of continuous aerobic without signs/symptoms of physical distress      Intensity   THRR 40-80% of Max Heartrate 68-135    Ratings of Perceived Exertion 11-13    Perceived Dyspnea 0-4      Resistance Training   Training Prescription Yes    Weight 5 lbs    Reps 10-15             Discharge Exercise Prescription (Final Exercise Prescription Changes):  Exercise Prescription Changes - 05/18/21 1000       Response to Exercise   Blood Pressure (Admit) 100/68    Blood Pressure (Exercise) 140/60    Blood Pressure (Exit) 110/60    Heart Rate (Admit) 81 bpm    Heart Rate (Exercise) 100 bpm    Heart Rate (Exit) 85 bpm    Rating of Perceived Exertion (Exercise) 12    Duration Continue with 30 min of aerobic exercise without signs/symptoms of physical distress.    Intensity THRR unchanged      Progression   Progression  Continue to progress workloads to maintain intensity without signs/symptoms of physical distress.      Resistance Training   Training Prescription Yes    Weight 5    Reps 10-15    Time 10 Minutes      Treadmill   MPH 3.1    Grade 2.5    Minutes 17    METs 4.44      Recumbant Elliptical   Level 5    RPM 55    Minutes 22    METs 3.7             Functional Capacity:  6 Minute Walk     Row Name 03/02/21 1410         6 Minute Walk   Phase Initial     Distance 1900 feet     Walk Time 6 minutes     # of Rest Breaks 0     MPH 3.6     METS 4.91     RPE 11     VO2 Peak 17.17     Symptoms No     Resting HR 82 bpm     Resting BP  124/56     Resting Oxygen Saturation  98 %     Exercise Oxygen Saturation  during 6 min walk 100 %     Max Ex. HR 103 bpm     Max Ex. BP 134/50     2 Minute Post BP 112/54              Psychological, QOL, Others - Outcomes: PHQ 2/9: Depression screen PHQ 2/9 03/02/2021  Decreased Interest 1  Down, Depressed, Hopeless 1  PHQ - 2 Score 2  Altered sleeping 1  Tired, decreased energy 0  Change in appetite 1  Feeling bad or failure about yourself  0  Trouble concentrating 1  Moving slowly or fidgety/restless 0  Suicidal thoughts 0  PHQ-9 Score 5  Difficult doing work/chores Somewhat difficult    Quality of Life:  Quality of Life - 03/02/21 1458       Quality of Life   Select Quality of Life      Quality of Life Scores   Health/Function Pre 21.2 %    Socioeconomic Pre 22.79 %    Psych/Spiritual Pre 25.29 %    Family Pre 24.6 %    GLOBAL Pre 22.87 %             Personal Goals: Goals established at orientation with interventions provided to work toward goal.  Personal Goals and Risk Factors at Admission - 03/02/21 1300       Core Components/Risk Factors/Patient Goals on Admission    Weight Management Yes;Weight Loss    Intervention Weight Management: Develop a combined nutrition and exercise program designed to  reach desired caloric intake, while maintaining appropriate intake of nutrient and fiber, sodium and fats, and appropriate energy expenditure required for the weight goal.;Weight Management: Provide education and appropriate resources to help participant work on and attain dietary goals.    Admit Weight 229 lb (103.9 kg)    Goal Weight: Short Term 220 lb (99.8 kg)    Expected Outcomes Short Term: Continue to assess and modify interventions until short term weight is achieved;Long Term: Adherence to nutrition and physical activity/exercise program aimed toward attainment of established weight goal;Weight Loss: Understanding of general recommendations for a balanced deficit meal plan, which promotes 1-2 lb weight loss per week and includes a negative energy balance of [phone] kcal/d;Understanding recommendations for meals to include 15-35% energy as protein, 25-35% energy from fat, 35-60% energy from carbohydrates, less than 241m of dietary cholesterol, 20-35 gm of total fiber daily;Understanding of distribution of calorie intake throughout the day with the consumption of 4-5 meals/snacks    Improve shortness of breath with ADL's Yes    Intervention Provide education, individualized exercise plan and daily activity instruction to help decrease symptoms of SOB with activities of daily living.    Expected Outcomes Short Term: Improve cardiorespiratory fitness to achieve a reduction of symptoms when performing ADLs;Long Term: Be able to perform more ADLs without symptoms or delay the onset of symptoms    Diabetes Yes    Intervention Provide education about signs/symptoms and action to take for hypo/hyperglycemia.;Provide education about proper nutrition, including hydration, and aerobic/resistive exercise prescription along with prescribed medications to achieve blood glucose in normal ranges: Fasting glucose 65-99 mg/dL    Expected Outcomes Short Term: Participant verbalizes understanding of the  signs/symptoms and immediate care of hyper/hypoglycemia, proper foot care and importance of medication, aerobic/resistive exercise and nutrition plan for blood glucose control.;Long Term: Attainment of HbA1C < 7%.  Personal Goals Discharge:  Goals and Risk Factor Review     Row Name 03/18/21 0951 04/13/21 1015 05/11/21 1258         Core Components/Risk Factors/Patient Goals Review   Personal Goals Review Weight Management/Obesity;Other;Improve shortness of breath with ADL's Weight Management/Obesity;Other;Improve shortness of breath with ADL's Weight Management/Obesity;Other;Improve shortness of breath with ADL's     Review Patient was referred to [facility] with CABGx1. He has multiple risk factors for CAD and is participating in the program for risk modification. He has completed 3 sessions. His personal goals for the program are to reduce his SOB with activity. We will continue to monitor his progress as he works towards meeting these goals. Patient has completed 11 sessions losing 4 lbs since last 30 day review. He is doing well in the program with consistent attendance and progressions. He blood pressure is well controlled. He is working full time without difficulty. His personal goals for the program continue to be to refuce SOB with activity. We will continue to monitor his progress as he works towards meeting these goals. Patient has completed 19 sessions losing 5 lbs since last 30 day review. He continue to do well in the program with consistent attendance and progressions. He blood pressure continues to be well controlled. He is working full time without difficulty. His personal goals for the program continue to be to refuce SOB with activity. We will continue to monitor his progress as he works towards meeting these goals.     Expected Outcomes Patient will complete the program meeting both personal and program goals. Patient will complete the program meeting both personal and  program goals. Patient will complete the program meeting both personal and program goals.              Exercise Goals and Review:  Exercise Goals     Row Name 03/02/21 1413 03/24/21 0910 04/20/21 1247 05/18/21 1029       Exercise Goals   Increase Physical Activity Yes Yes Yes Yes    Intervention Provide advice, education, support and counseling about physical activity/exercise needs.;Develop an individualized exercise prescription for aerobic and resistive training based on initial evaluation findings, risk stratification, comorbidities and participant's personal goals. Provide advice, education, support and counseling about physical activity/exercise needs.;Develop an individualized exercise prescription for aerobic and resistive training based on initial evaluation findings, risk stratification, comorbidities and participant's personal goals. Provide advice, education, support and counseling about physical activity/exercise needs.;Develop an individualized exercise prescription for aerobic and resistive training based on initial evaluation findings, risk stratification, comorbidities and participant's personal goals. Provide advice, education, support and counseling about physical activity/exercise needs.;Develop an individualized exercise prescription for aerobic and resistive training based on initial evaluation findings, risk stratification, comorbidities and participant's personal goals.    Expected Outcomes Short Term: Attend rehab on a regular basis to increase amount of physical activity.;Long Term: Add in home exercise to make exercise part of routine and to increase amount of physical activity.;Long Term: Exercising regularly at least 3-5 days a week. Short Term: Attend rehab on a regular basis to increase amount of physical activity.;Long Term: Add in home exercise to make exercise part of routine and to increase amount of physical activity.;Long Term: Exercising regularly at least 3-5  days a week. Short Term: Attend rehab on a regular basis to increase amount of physical activity.;Long Term: Add in home exercise to make exercise part of routine and to increase amount of physical activity.;Long Term: Exercising regularly at least 3-5  days a week. Short Term: Attend rehab on a regular basis to increase amount of physical activity.;Long Term: Add in home exercise to make exercise part of routine and to increase amount of physical activity.;Long Term: Exercising regularly at least 3-5 days a week.    Increase Strength and Stamina Yes Yes Yes Yes    Intervention Provide advice, education, support and counseling about physical activity/exercise needs.;Develop an individualized exercise prescription for aerobic and resistive training based on initial evaluation findings, risk stratification, comorbidities and participant's personal goals. Provide advice, education, support and counseling about physical activity/exercise needs.;Develop an individualized exercise prescription for aerobic and resistive training based on initial evaluation findings, risk stratification, comorbidities and participant's personal goals. Provide advice, education, support and counseling about physical activity/exercise needs.;Develop an individualized exercise prescription for aerobic and resistive training based on initial evaluation findings, risk stratification, comorbidities and participant's personal goals. Provide advice, education, support and counseling about physical activity/exercise needs.;Develop an individualized exercise prescription for aerobic and resistive training based on initial evaluation findings, risk stratification, comorbidities and participant's personal goals.    Expected Outcomes Short Term: Increase workloads from initial exercise prescription for resistance, speed, and METs.;Short Term: Perform resistance training exercises routinely during rehab and add in resistance training at home;Long Term:  Improve cardiorespiratory fitness, muscular endurance and strength as measured by increased METs and functional capacity (6MWT) Short Term: Increase workloads from initial exercise prescription for resistance, speed, and METs.;Short Term: Perform resistance training exercises routinely during rehab and add in resistance training at home;Long Term: Improve cardiorespiratory fitness, muscular endurance and strength as measured by increased METs and functional capacity (6MWT) Short Term: Increase workloads from initial exercise prescription for resistance, speed, and METs.;Short Term: Perform resistance training exercises routinely during rehab and add in resistance training at home;Long Term: Improve cardiorespiratory fitness, muscular endurance and strength as measured by increased METs and functional capacity (6MWT) Short Term: Increase workloads from initial exercise prescription for resistance, speed, and METs.;Short Term: Perform resistance training exercises routinely during rehab and add in resistance training at home;Long Term: Improve cardiorespiratory fitness, muscular endurance and strength as measured by increased METs and functional capacity (6MWT)    Able to understand and use rate of perceived exertion (RPE) scale Yes Yes Yes Yes    Intervention Provide education and explanation on how to use RPE scale Provide education and explanation on how to use RPE scale Provide education and explanation on how to use RPE scale Provide education and explanation on how to use RPE scale    Expected Outcomes Short Term: Able to use RPE daily in rehab to express subjective intensity level;Long Term:  Able to use RPE to guide intensity level when exercising independently Short Term: Able to use RPE daily in rehab to express subjective intensity level;Long Term:  Able to use RPE to guide intensity level when exercising independently Short Term: Able to use RPE daily in rehab to express subjective intensity level;Long  Term:  Able to use RPE to guide intensity level when exercising independently Short Term: Able to use RPE daily in rehab to express subjective intensity level;Long Term:  Able to use RPE to guide intensity level when exercising independently    Knowledge and understanding of Target Heart Rate Range (THRR) Yes Yes Yes Yes    Intervention Provide education and explanation of THRR including how the numbers were predicted and where they are located for reference Provide education and explanation of THRR including how the numbers were predicted and where they are  located for reference Provide education and explanation of THRR including how the numbers were predicted and where they are located for reference Provide education and explanation of THRR including how the numbers were predicted and where they are located for reference    Expected Outcomes Short Term: Able to state/look up THRR;Long Term: Able to use THRR to govern intensity when exercising independently;Short Term: Able to use daily as guideline for intensity in rehab Short Term: Able to state/look up THRR;Long Term: Able to use THRR to govern intensity when exercising independently;Short Term: Able to use daily as guideline for intensity in rehab Short Term: Able to state/look up THRR;Long Term: Able to use THRR to govern intensity when exercising independently;Short Term: Able to use daily as guideline for intensity in rehab Short Term: Able to state/look up THRR;Long Term: Able to use THRR to govern intensity when exercising independently;Short Term: Able to use daily as guideline for intensity in rehab    Able to check pulse independently Yes Yes Yes Yes    Intervention Provide education and demonstration on how to check pulse in carotid and radial arteries.;Review the importance of being able to check your own pulse for safety during independent exercise Provide education and demonstration on how to check pulse in carotid and radial arteries.;Review  the importance of being able to check your own pulse for safety during independent exercise Provide education and demonstration on how to check pulse in carotid and radial arteries.;Review the importance of being able to check your own pulse for safety during independent exercise Provide education and demonstration on how to check pulse in carotid and radial arteries.;Review the importance of being able to check your own pulse for safety during independent exercise    Expected Outcomes Short Term: Able to explain why pulse checking is important during independent exercise;Long Term: Able to check pulse independently and accurately Short Term: Able to explain why pulse checking is important during independent exercise;Long Term: Able to check pulse independently and accurately Short Term: Able to explain why pulse checking is important during independent exercise;Long Term: Able to check pulse independently and accurately Short Term: Able to explain why pulse checking is important during independent exercise;Long Term: Able to check pulse independently and accurately    Understanding of Exercise Prescription Yes Yes Yes Yes    Intervention Provide education, explanation, and written materials on patient's individual exercise prescription Provide education, explanation, and written materials on patient's individual exercise prescription Provide education, explanation, and written materials on patient's individual exercise prescription Provide education, explanation, and written materials on patient's individual exercise prescription    Expected Outcomes Short Term: Able to explain program exercise prescription;Long Term: Able to explain home exercise prescription to exercise independently Short Term: Able to explain program exercise prescription;Long Term: Able to explain home exercise prescription to exercise independently Short Term: Able to explain program exercise prescription;Long Term: Able to explain home  exercise prescription to exercise independently Short Term: Able to explain program exercise prescription;Long Term: Able to explain home exercise prescription to exercise independently             Exercise Goals Re-Evaluation:  Exercise Goals Re-Evaluation     Row Name 03/24/21 0910 04/20/21 1247 05/18/21 1030         Exercise Goal Re-Evaluation   Exercise Goals Review Increase Physical Activity;Increase Strength and Stamina;Able to understand and use rate of perceived exertion (RPE) scale;Knowledge and understanding of Target Heart Rate Range (THRR);Able to check pulse independently;Understanding of Exercise Prescription Increase  Physical Activity;Increase Strength and Stamina;Able to understand and use rate of perceived exertion (RPE) scale;Knowledge and understanding of Target Heart Rate Range (THRR);Able to check pulse independently;Understanding of Exercise Prescription Increase Physical Activity;Increase Strength and Stamina;Able to understand and use rate of perceived exertion (RPE) scale;Knowledge and understanding of Target Heart Rate Range (THRR);Able to check pulse independently;Understanding of Exercise Prescription     Comments Pt has completed 5 sessions of cardiac rehab. He missed several sessions due to having COVID but has since returned with no problems. He was fit at his baseline and should progress well in the program. He is currently exercising at 3.0 METs on the elliptical. Will continue to monitor and progress as able. Pt has completed 13 session of cardiac rehab. He continues to progress while in the program by increasing his work load and exercising at home. He is currently at a work load of 4.33 METs on the TM. Pt has completed 21 sessions of cardiac rehad. He continues to show progressing in the program by increasing worl load and also exercising at home. He is currently exercsing at a work load of 4.44 METs on the TM.     Expected Outcomes Through exercise at rehab and  at home, the patient will meet their stated goals. Through exercise at rehab and at home, the patient will meet their stated goals. Through exercise at rehab and at home, the patient will meet their stated goals.              Nutrition & Weight - Outcomes:  Pre Biometrics - 03/02/21 1414       Pre Biometrics   Height 6' 1"  (1.854 m)    Weight 233 lb 0.4 oz (105.7 kg)    Waist Circumference 41.5 inches    Hip Circumference 44 inches    Waist to Hip Ratio 0.94 %    BMI (Calculated) 30.75    Triceps Skinfold 10 mm    % Body Fat 26.6 %    Grip Strength 37.6 kg    Flexibility 0 in    Single Leg Stand 46 seconds              Nutrition:   Nutrition Discharge:  Nutrition Assessments - 03/02/21 1254       MEDFICTS Scores   Pre Score 3             Education Questionnaire Score:  Knowledge Questionnaire Score - 03/02/21 1256       Knowledge Questionnaire Score   Pre Score 17/24             Pt is discharged from cardiac rehab after 21 sessions. He was admitted 11/7 to [facility] and transported to [facility] in [place] and diagnosed with a stroke. While in the program, he gave great effort and had consistent attendance. His MET level at discharge was 5.0.

## 2022-06-16 IMAGING — DX DG CHEST 1V PORT
1 series · 1 of 1 positions shown · non-contrast
Comparison: 11/29/2020

CLINICAL DATA: Status post CABG.

EXAM:
PORTABLE CHEST 1 VIEW

[chest ap]
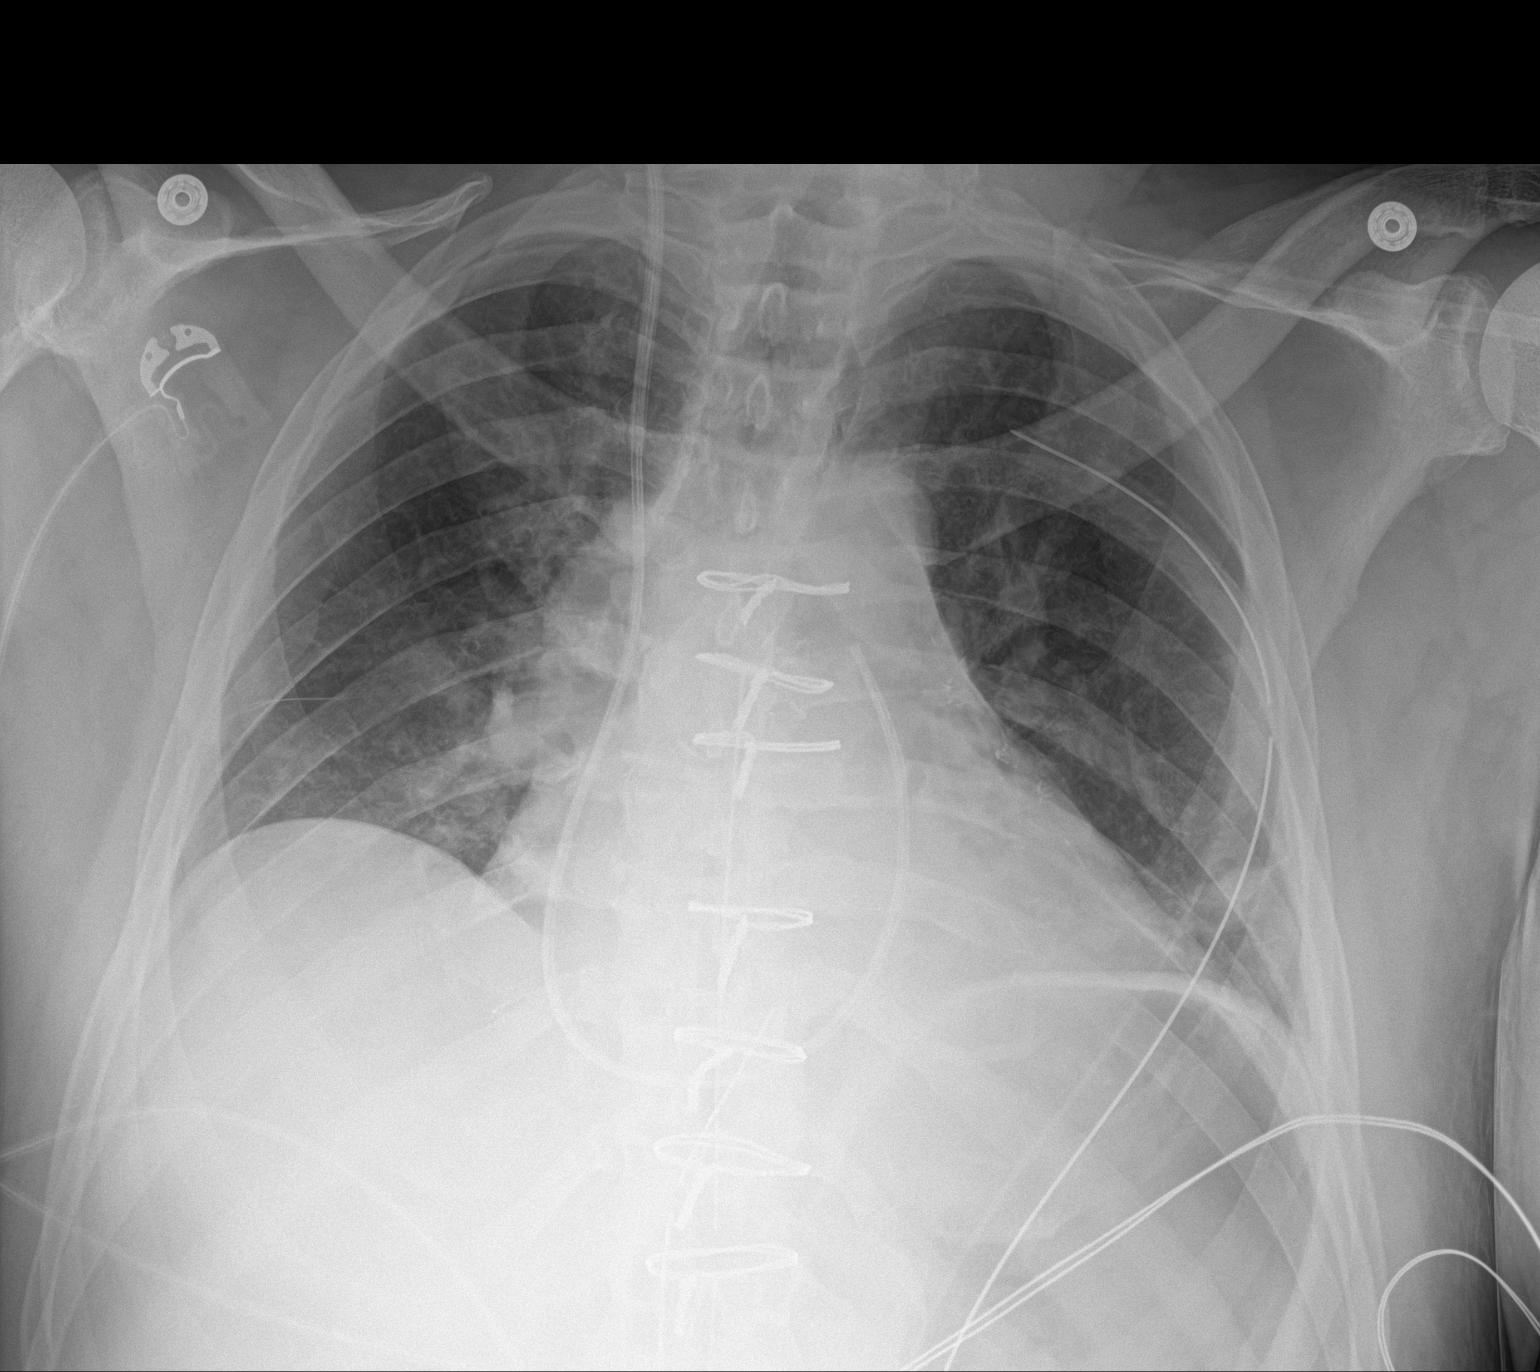

[1 of 1 positions shown; findings below may reference images not displayed]

FINDINGS: CABG changes, RIGHT IJ Swan-Ganz catheter with tip overlying the
pulmonary outflow tract/main pulmonary artery, and mediastinal/LEFT
thoracostomy tube is are again noted.

An endotracheal tube is been removed.

Mild bibasilar atelectasis again identified.

There is no evidence of pneumothorax, large pleural effusion or
edema.
IMPRESSION: Endotracheal tube removal without other significant change. No
pneumothorax.

## 2022-07-22 ENCOUNTER — Encounter: Payer: Self-pay | Admitting: "Endocrinology

## 2022-07-22 ENCOUNTER — Ambulatory Visit (INDEPENDENT_AMBULATORY_CARE_PROVIDER_SITE_OTHER): Payer: No Typology Code available for payment source | Admitting: "Endocrinology

## 2022-07-22 VITALS — BP 108/56 | HR 60 | Ht 73.0 in | Wt 188.4 lb

## 2022-07-22 DIAGNOSIS — E0590 Thyrotoxicosis, unspecified without thyrotoxic crisis or storm: Secondary | ICD-10-CM | POA: Insufficient documentation

## 2022-07-22 NOTE — Progress Notes (Signed)
Endocrinology Consult Note                                            07/22/2022, 11:36 AM   Subjective:    Patient ID: Allen Walton, male    DOB: 10/07/1970, PCP Delorise Shiner, MD   Past Medical History:  Diagnosis Date   Anginal pain ([place])    Diabetes mellitus without complication ([place])    Myocardial infarction [facility])    Past Surgical History:  Procedure Laterality Date   CORONARY ARTERY BYPASS GRAFT N/A 11/29/2020   Procedure: CORONARY ARTERY BYPASS GRAFTING (CABG) TIMES ONE, ON PUMP, USING LEFT INTERNAL MAMMARY ARTERY;  Surgeon: Gaye Pollack, MD;  Location: [place];  Service: Open Heart Surgery;  Laterality: N/A;   CORONARY/GRAFT ACUTE MI REVASCULARIZATION N/A 11/29/2020   Procedure: CORONARY/GRAFT ACUTE MI REVASCULARIZATION;  Surgeon: Lorretta Harp, MD;  Location: [place] CV [facility];  Service: Cardiovascular;  Laterality: N/A;   HERNIA REPAIR     TEE WITHOUT CARDIOVERSION N/A 11/29/2020   Procedure: TRANSESOPHAGEAL ECHOCARDIOGRAM (TEE);  Surgeon: Gaye Pollack, MD;  Location: [place];  Service: Open Heart Surgery;  Laterality: N/A;   Social History   Socioeconomic History   Marital status: Married    Spouse name: Not on file   Number of children: Not on file   Years of education: Not on file   Highest education level: Not on file  Occupational History   Not on file  Tobacco Use   Smoking status: Former    Types: Cigarettes   Smokeless tobacco: Never  Vaping Use   Vaping Use: Never used  Substance and Sexual Activity   Alcohol use: Never   Drug use: Never   Sexual activity: Not on file  Other Topics Concern   Not on file  Social History Narrative   Not on file   Social Determinants of Health   Financial Resource Strain: Not on file  Food Insecurity: Not on file  Transportation Needs: Not on file  Physical Activity: Not on file  Stress: Not on file  Social Connections: Not on file   Family History  Problem Relation Age of Onset    Hypertension Father    Outpatient Encounter Medications as of 07/22/2022  Medication Sig   apixaban (ELIQUIS) 5 MG TABS tablet Take 5 mg by mouth 2 (two) times daily.   Cholecalciferol (VITAMIN D-1000 MAX ST) 25 MCG (1000 UT) tablet Take 1,000 Units by mouth daily.   diclofenac Sodium (VOLTAREN) 1 % GEL Apply topically.   empagliflozin (JARDIANCE) 25 MG TABS tablet Take 25 mg by mouth daily.   amiodarone (PACERONE) 200 MG tablet Take 2 tablets (400 mg total) by mouth 2 (two) times daily for 5 days then reduce the dose to 1 tablet (200mg ) by mouth twice daily.   aspirin 81 MG EC tablet Take 1 tablet (81 mg total) by mouth daily. Swallow whole.   atorvastatin (LIPITOR) 80 MG tablet Take 1 tablet (80 mg total) by mouth daily.   clopidogrel (PLAVIX) 75 MG tablet Take 1 tablet (75 mg total) by mouth daily.   glipiZIDE (GLUCOTROL) 5 MG tablet Take 2.5 mg by mouth 2 (two) times daily before a meal. (Patient not taking: Reported on 07/22/2022)   metFORMIN (GLUCOPHAGE) 1000 MG tablet Take 1,000 mg by mouth 2 (two) times daily with  a meal.   metoprolol tartrate (LOPRESSOR) 25 MG tablet Take 1 tablet (25 mg total) by mouth 2 (two) times daily.   TechLite Lancets MISC Inject 1 applicator into the skin daily as needed.   No facility-administered encounter medications on file as of 07/22/2022.   ALLERGIES: No Known Allergies  VACCINATION STATUS:  There is no immunization history on file for this patient.  HPI Allen Walton is 52 y.o. male who presents today with a medical history as above. he is being seen in consultation for hyperthyroidism requested by Delorise Shiner, MD. Patient is not an optimal historian.  Review of his medical records indicate evidence of hyperthyroidism at least from November 2020 with suppressed TSH and elevated thyroid hormone levels.  However patient states that he was treated only from November 2023 when his free T4 was 5.98.  He is taking unknown dose of methimazole.  His list  of medications does not include methimazole.   Patient reports that he has lost 40-50 pounds of weight over the year.  He also had tremors.however he denies palpitations sleep disturbance nor irritability.  He has comorbid conditions of hyperlipidemia, hypertension, complicated by at least 1 acute coronary syndrome.  He medications include apixaban, aspirin, atorvastatin, Jardiance, metformin, metoprolol, vitamin D, and vitamin B12.  He denies any family history of thyroid dysfunction or thyroid malignancy.  Review of Systems  Constitutional: + Weight loss, no fatigue, no subjective hyperthermia, no subjective hypothermia Eyes: no blurry vision, no xerophthalmia ENT: no sore throat, no nodules palpated in throat, no dysphagia/odynophagia, no hoarseness Cardiovascular: no Chest Pain, no Shortness of Breath, no palpitations, no leg swelling Respiratory: no cough, no shortness of breath Gastrointestinal: no Nausea/Vomiting/Diarhhea Musculoskeletal: no muscle/joint aches Skin: no rashes Neurological: no tremors, no numbness, no tingling, no dizziness Psychiatric: no depression, no anxiety  Objective:       07/22/2022    9:35 AM 05/18/2021   10:21 AM 05/04/2021   12:38 PM  Vitals with BMI  Height 6\' 1"     Weight 188 lbs 6 oz 220 lbs 14 oz 222 lbs 4 oz  BMI 16.10    Systolic 960    Diastolic 56    Pulse 60      BP (!) 108/56   Pulse 60   Ht 6\' 1"  (1.854 m)   Wt 188 lb 6.4 oz (85.5 kg)   BMI 24.86 kg/m   Wt Readings from Last 3 Encounters:  07/22/22 188 lb 6.4 oz (85.5 kg)  05/18/21 220 lb 14.4 oz (100.2 kg)  05/04/21 222 lb 3.6 oz (100.8 kg)    Physical Exam  Constitutional:  Body mass index is 24.86 kg/m.,  not in acute distress, normal state of mind Eyes: PERRLA, EOMI, no exophthalmos ENT: moist mucous membranes, + gross thyromegaly, no gross cervical lymphadenopathy Cardiovascular: normal precordial activity, Regular Rate and Rhythm, no Murmur/Rubs/Gallops Respiratory:   adequate breathing efforts, no gross chest deformity, Clear to auscultation bilaterally Gastrointestinal: abdomen soft, Non -tender, No distension, Bowel Sounds present, no gross organomegaly Musculoskeletal: no gross deformities, strength intact in all four extremities Skin: moist, warm, no rashes Neurological: + tremor with outstretched hands, Deep tendon reflexes normal in bilateral lower extremities.  CMP ( most recent) CMP     Component Value Date/Time   NA 135 12/02/2020 0807   K 3.9 12/02/2020 0807   CL 101 12/02/2020 0807   CO2 26 12/02/2020 0807   GLUCOSE 127 (H) 12/02/2020 0807   BUN 12 12/02/2020 0807  CREATININE 1.11 12/02/2020 0807   CALCIUM 8.7 (L) 12/02/2020 0807   PROT 6.1 (L) 11/29/2020 1415   ALBUMIN 3.3 (L) 11/29/2020 1415   AST 16 11/29/2020 1415   ALT 16 11/29/2020 1415   ALKPHOS 41 11/29/2020 1415   BILITOT 0.6 11/29/2020 1415   GFRNONAA >60 12/02/2020 0807     Diabetic Labs (most recent): Lab Results  Component Value Date   HGBA1C 7.3 (H) 11/29/2020     Lipid Panel ( most recent) Lipid Panel     Component Value Date/Time   CHOL 107 12/02/2020 0434   TRIG 139 12/02/2020 0434   HDL 16 (L) 12/02/2020 0434   CHOLHDL 6.7 12/02/2020 0434   VLDL 28 12/02/2020 0434   LDLCALC 63 12/02/2020 0434      Assessment & Plan:   1. Hyperthyroidism  - Allen Walton  is being seen at a kind request of Concha Pyo, MD. - I have reviewed his available thyroid records and clinically evaluated the patient. Based on these reviews, patient does have hypothyroidism, has been on treatment at least since November 2023 with methimazole.  He does not know the dose of his methimazole. He will need repeat full set thyroid function test today and patient is advised to return with all of his medications for reconciliation. If his treatment response is adequate, he will be continued on methimazole treatment.  If his response is not adequate, he will be considered for  thyroid uptake and scan in preparation for radioactive iodine thyroid ablation. -Patient is not reliable for the dose and frequency of his medications, at risk of polypharmacy and drug drug interactions.  He will benefit from medication reconciliation also in terms of his other comorbid conditions including hypertension, hyperlipidemia, and diabetes.  Once his hypothyroidism is taking care of, he will be considered for lifestyle medicine to help him address his chronic diseases.  - he is advised to maintain close follow up with Concha Pyo, MD for primary care needs.   - Time spent with the patient: 45 minutes, of which >50% was spent in  counseling him about his hyperthyroidism and the rest in obtaining information about his symptoms, reviewing his previous labs/studies ( including abstractions from other facilities),  evaluations, and treatments,  and developing a plan to confirm diagnosis and long term treatment based on the latest standards of care/guidelines; and documenting his care.  Scherrie Gerlach participated in the discussions, expressed understanding, and voiced agreement with the above plans.  All questions were answered to his satisfaction. he is encouraged to contact clinic should he have any questions or concerns prior to his return visit.  Follow up plan: Return in about 1 week (around 07/29/2022), or Bring all of your medications next visit, for Labs Today- Non-Fasting Ok.   Marquis Lunch, MD [facility] [address] [place], [place] [address] Phone: [phone]  Fax: [phone]     07/22/2022, 11:36 AM  This note was partially dictated with voice recognition software. Similar sounding words can be transcribed inadequately or may not  be corrected upon review.

## 2022-07-23 LAB — T3, FREE: T3, Free: 3 pg/mL (ref 2.0–4.4)

## 2022-07-23 LAB — THYROGLOBULIN ANTIBODY: Thyroglobulin Antibody: <1 IU/mL (ref 0.0–0.9)

## 2022-07-23 LAB — TSH: TSH: <0.005 u[IU]/mL — ABNORMAL LOW (ref 0.450–4.500)

## 2022-07-23 LAB — T4, FREE: Free T4: 1.13 ng/dL (ref 0.82–1.77)

## 2022-07-23 LAB — THYROID PEROXIDASE ANTIBODY: Thyroperoxidase Ab SerPl-aCnc: <9 IU/mL (ref 0–34)

## 2022-07-30 ENCOUNTER — Encounter: Payer: Self-pay | Admitting: "Endocrinology

## 2022-07-30 ENCOUNTER — Ambulatory Visit (INDEPENDENT_AMBULATORY_CARE_PROVIDER_SITE_OTHER): Payer: No Typology Code available for payment source | Admitting: "Endocrinology

## 2022-07-30 VITALS — BP 100/64 | HR 68 | Ht 73.0 in | Wt 191.4 lb

## 2022-07-30 DIAGNOSIS — E0590 Thyrotoxicosis, unspecified without thyrotoxic crisis or storm: Secondary | ICD-10-CM

## 2022-07-30 NOTE — Progress Notes (Signed)
Endocrinology follow-up note                                             07/30/2022, 1:24 PM   Subjective:    Patient ID: Allen Walton, male    DOB: May 22, 1971, PCP Delorise Shiner, MD   Past Medical History:  Diagnosis Date   Anginal pain ([place])    Diabetes mellitus without complication ([place])    Myocardial infarction [facility])    Past Surgical History:  Procedure Laterality Date   CORONARY ARTERY BYPASS GRAFT N/A 11/29/2020   Procedure: CORONARY ARTERY BYPASS GRAFTING (CABG) TIMES ONE, ON PUMP, USING LEFT INTERNAL MAMMARY ARTERY;  Surgeon: Gaye Pollack, MD;  Location: [place];  Service: Open Heart Surgery;  Laterality: N/A;   CORONARY/GRAFT ACUTE MI REVASCULARIZATION N/A 11/29/2020   Procedure: CORONARY/GRAFT ACUTE MI REVASCULARIZATION;  Surgeon: Lorretta Harp, MD;  Location: [place] [facility];  Service: Cardiovascular;  Laterality: N/A;   HERNIA REPAIR     TEE WITHOUT CARDIOVERSION N/A 11/29/2020   Procedure: TRANSESOPHAGEAL ECHOCARDIOGRAM (TEE);  Surgeon: Gaye Pollack, MD;  Location: [place];  Service: Open Heart Surgery;  Laterality: N/A;   Social History   Socioeconomic History   Marital status: Married    Spouse name: Not on file   Number of children: Not on file   Years of education: Not on file   Highest education level: Not on file  Occupational History   Not on file  Tobacco Use   Smoking status: Former    Types: Cigarettes   Smokeless tobacco: Never  Vaping Use   Vaping Use: Never used  Substance and Sexual Activity   Alcohol use: Never   Drug use: Never   Sexual activity: Not on file  Other Topics Concern   Not on file  Social History Narrative   Not on file   Social Determinants of Health   Financial Resource Strain: Not on file  Food Insecurity: Not on file  Transportation Needs: Not on file  Physical Activity: Not on file  Stress: Not on file  Social Connections: Not on file   Family History  Problem Relation Age of Onset    Hypertension Father    Outpatient Encounter Medications as of 07/30/2022  Medication Sig   methimazole (TAPAZOLE) 10 MG tablet Take 10 mg by mouth daily with breakfast.   amiodarone (PACERONE) 200 MG tablet Take 2 tablets (400 mg total) by mouth 2 (two) times daily for 5 days then reduce the dose to 1 tablet (200mg ) by mouth twice daily. (Patient not taking: Reported on 07/30/2022)   apixaban (ELIQUIS) 5 MG TABS tablet Take 5 mg by mouth 2 (two) times daily.   aspirin 81 MG EC tablet Take 1 tablet (81 mg total) by mouth daily. Swallow whole.   atorvastatin (LIPITOR) 80 MG tablet Take 1 tablet (80 mg total) by mouth daily.   Cholecalciferol (VITAMIN D-1000 MAX ST) 25 MCG (1000 UT) tablet Take 1,000 Units by mouth daily. (Patient not taking: Reported on 07/30/2022)   clopidogrel (PLAVIX) 75 MG tablet Take 1 tablet (75 mg total) by mouth daily. (Patient not taking: Reported on 07/30/2022)   diclofenac Sodium (VOLTAREN) 1 % GEL Apply topically.   empagliflozin (JARDIANCE) 25 MG TABS tablet Take 25 mg by mouth daily.   metFORMIN (GLUCOPHAGE) 1000 MG tablet Take  1,000 mg by mouth 2 (two) times daily with a meal.   metoprolol tartrate (LOPRESSOR) 25 MG tablet Take 1 tablet (25 mg total) by mouth 2 (two) times daily.   TechLite Lancets MISC Inject 1 applicator into the skin daily as needed.   [DISCONTINUED] glipiZIDE (GLUCOTROL) 5 MG tablet Take 2.5 mg by mouth 2 (two) times daily before a meal. (Patient not taking: Reported on 07/22/2022)   No facility-administered encounter medications on file as of 07/30/2022.   ALLERGIES: No Known Allergies  VACCINATION STATUS:  There is no immunization history on file for this patient.  HPI Allen Walton is 52 y.o. male who presents today with a medical history as above. he is being seen in f/u after he was seen in  consultation for hyperthyroidism requested by Concha Pyo, MD. See notes from his prior visit.  Review of his medical records indicate evidence of  hyperthyroidism at least from November 2020 with suppressed TSH and elevated thyroid hormone levels.  However patient states that he was treated only from November 2023 when his free T4 was 5.98.  He is taking methimazole 20 mg po daily.  He took amiodarone until last November.   Patient reports that he has lost 40-50 pounds of weight over the year.  He also had tremors which has been improving.   He has comorbid conditions of hyperlipidemia, hypertension, complicated by at least 1 acute coronary syndrome.  He medications include apixaban, aspirin, atorvastatin, Jardiance, metformin, metoprolol, vitamin D, and vitamin B12.  He denies any family history of thyroid dysfunction or thyroid malignancy.  Review of Systems  Constitutional: + Weight loss, no fatigue, no subjective hyperthermia, no subjective hypothermia Eyes: no blurry vision, no xerophthalmia ENT: no sore throat, no nodules palpated in throat, no dysphagia/odynophagia, no hoarseness   Objective:       07/30/2022    9:25 AM 07/22/2022    9:35 AM 05/18/2021   10:21 AM  Vitals with BMI  Height 6\' 1"  6\' 1"    Weight 191 lbs 6 oz 188 lbs 6 oz 220 lbs 14 oz  BMI 25.26 24.86   Systolic 100 108   Diastolic 64 56   Pulse 68 60     BP 100/64   Pulse 68   Ht 6\' 1"  (1.854 m)   Wt 191 lb 6.4 oz (86.8 kg)   BMI 25.25 kg/m   Wt Readings from Last 3 Encounters:  07/30/22 191 lb 6.4 oz (86.8 kg)  07/22/22 188 lb 6.4 oz (85.5 kg)  05/18/21 220 lb 14.4 oz (100.2 kg)    Physical Exam  Constitutional:  Body mass index is 25.25 kg/m.,  not in acute distress, normal state of mind Eyes: PERRLA, EOMI, no exophthalmos ENT: moist mucous membranes, + gross thyromegaly, no gross cervical lymphadenopathy Cardiovascular: normal precordial activity, Regular Rate and Rhythm, no Murmur/Rubs/Gallops   CMP ( most recent) CMP     Component Value Date/Time   NA 135 12/02/2020 0807   K 3.9 12/02/2020 0807   CL 101 12/02/2020 0807   CO2 26  12/02/2020 0807   GLUCOSE 127 (H) 12/02/2020 0807   BUN 12 12/02/2020 0807   CREATININE 1.11 12/02/2020 0807   CALCIUM 8.7 (L) 12/02/2020 0807   PROT 6.1 (L) 11/29/2020 1415   ALBUMIN 3.3 (L) 11/29/2020 1415   AST 16 11/29/2020 1415   ALT 16 11/29/2020 1415   ALKPHOS 41 11/29/2020 1415   BILITOT 0.6 11/29/2020 1415   GFRNONAA >60 12/02/2020 12/01/2020  Diabetic Labs (most recent): Lab Results  Component Value Date   HGBA1C 7.3 (H) 11/29/2020     Lipid Panel ( most recent) Lipid Panel     Component Value Date/Time   CHOL 107 12/02/2020 0434   TRIG 139 12/02/2020 0434   HDL 16 (L) 12/02/2020 0434   CHOLHDL 6.7 12/02/2020 0434   VLDL 28 12/02/2020 0434   LDLCALC 63 12/02/2020 0434      Assessment & Plan:   1. Hyperthyroidism   - I have reviewed his available thyroid records and clinically evaluated the patient. Based on these reviews, patient does have hyperthyroidism, has been on treatment at least since November 2023 with methimazole. I advised him to lower his Methimazole to 10mg  po qday at breakfast with plan to repeat TFTs before his next visit in 7 weeks. Side effects are discussed with him. If he develops relapse on a later day, he will be considered for thyroid uptake and scan and RAI thyroid ablation. This patient took amiodarone in the past for cardiac dysrhythmia.   Once his hypothyroidism is taking care of, he will be considered for lifestyle medicine to help him address his chronic diseases.  - he is advised to maintain close follow up with Delorise Shiner, MD for primary care needs.  I spent 21 minutes in the care of the patient today including review of labs from Thyroid Function, CMP, and other relevant labs ; imaging/biopsy records (current and previous including abstractions from other facilities); face-to-face time discussing  his lab results and symptoms, medications doses, his options of short and long term treatment based on the latest standards of care  / guidelines;   and documenting the encounter.  Allen Walton  participated in the discussions, expressed understanding, and voiced agreement with the above plans.  All questions were answered to his satisfaction. he is encouraged to contact clinic should he have any questions or concerns prior to his return visit.  Follow up plan: Return in about 7 weeks (around 09/17/2022) for F/U with Pre-visit Labs.   Glade Lloyd, MD [facility] [address] [place], [place] [address] Phone: [phone]  Fax: [phone]     07/30/2022, 1:24 PM  This note was partially dictated with voice recognition software. Similar sounding words can be transcribed inadequately or may not  be corrected upon review.

## 2022-09-11 LAB — T4, FREE: Free T4: 1.42 ng/dL (ref 0.82–1.77)

## 2022-09-11 LAB — TSH: TSH: 0.007 u[IU]/mL — ABNORMAL LOW (ref 0.450–4.500)

## 2022-09-11 LAB — T3, FREE: T3, Free: 3.7 pg/mL (ref 2.0–4.4)

## 2022-09-17 ENCOUNTER — Ambulatory Visit: Payer: Non-veteran care | Admitting: "Endocrinology

## 2022-09-17 ENCOUNTER — Encounter: Payer: Self-pay | Admitting: "Endocrinology

## 2022-09-17 ENCOUNTER — Ambulatory Visit (INDEPENDENT_AMBULATORY_CARE_PROVIDER_SITE_OTHER): Payer: No Typology Code available for payment source | Admitting: "Endocrinology

## 2022-09-17 VITALS — BP 94/58 | HR 56 | Ht 74.0 in | Wt 194.0 lb

## 2022-09-17 DIAGNOSIS — E0590 Thyrotoxicosis, unspecified without thyrotoxic crisis or storm: Secondary | ICD-10-CM

## 2022-09-17 NOTE — Progress Notes (Signed)
Endocrinology follow-up note                                             09/17/2022, 2:02 PM   Subjective:    Patient ID: Allen Walton, male    DOB: [birth year], PCP Delorise Shiner, MD   Past Medical History:  Diagnosis Date   Anginal pain ([place])    Diabetes mellitus without complication ([place])    Myocardial infarction [facility])    Past Surgical History:  Procedure Laterality Date   CORONARY ARTERY BYPASS GRAFT N/A 11/29/2020   Procedure: CORONARY ARTERY BYPASS GRAFTING (CABG) TIMES ONE, ON PUMP, USING LEFT INTERNAL MAMMARY ARTERY;  Surgeon: Gaye Pollack, MD;  Location: [place];  Service: Open Heart Surgery;  Laterality: N/A;   CORONARY/GRAFT ACUTE MI REVASCULARIZATION N/A 11/29/2020   Procedure: CORONARY/GRAFT ACUTE MI REVASCULARIZATION;  Surgeon: Lorretta Harp, MD;  Location: [place] CV LAB;  Service: Cardiovascular;  Laterality: N/A;   HERNIA REPAIR     TEE WITHOUT CARDIOVERSION N/A 11/29/2020   Procedure: TRANSESOPHAGEAL ECHOCARDIOGRAM (TEE);  Surgeon: Gaye Pollack, MD;  Location: [place];  Service: Open Heart Surgery;  Laterality: N/A;   Social History   Socioeconomic History   Marital status: Married    Spouse name: Not on file   Number of children: Not on file   Years of education: Not on file   Highest education level: Not on file  Occupational History   Not on file  Tobacco Use   Smoking status: Former    Types: Cigarettes   Smokeless tobacco: Never  Vaping Use   Vaping Use: Never used  Substance and Sexual Activity   Alcohol use: Never   Drug use: Never   Sexual activity: Not on file  Other Topics Concern   Not on file  Social History Narrative   Not on file   Social Determinants of Health   Financial Resource Strain: Not on file  Food Insecurity: Not on file  Transportation Needs: Not on file  Physical Activity: Not on file  Stress: Not on file  Social Connections: Not on file   Family History  Problem Relation Age of Onset    Hypertension Father    Outpatient Encounter Medications as of 09/17/2022  Medication Sig   amiodarone (PACERONE) 200 MG tablet Take 2 tablets (400 mg total) by mouth 2 (two) times daily for 5 days then reduce the dose to 1 tablet ('200mg'$ ) by mouth twice daily. (Patient not taking: Reported on 07/30/2022)   apixaban (ELIQUIS) 5 MG TABS tablet Take 5 mg by mouth 2 (two) times daily.   aspirin 81 MG EC tablet Take 1 tablet (81 mg total) by mouth daily. Swallow whole.   atorvastatin (LIPITOR) 80 MG tablet Take 1 tablet (80 mg total) by mouth daily.   Cholecalciferol (VITAMIN D-1000 MAX ST) 25 MCG (1000 UT) tablet Take 1,000 Units by mouth daily. (Patient not taking: Reported on 07/30/2022)   clopidogrel (PLAVIX) 75 MG tablet Take 1 tablet (75 mg total) by mouth daily. (Patient not taking: Reported on 07/30/2022)   diclofenac Sodium (VOLTAREN) 1 % GEL Apply topically.   empagliflozin (JARDIANCE) 25 MG TABS tablet Take 25 mg by mouth daily.   metFORMIN (GLUCOPHAGE) 1000 MG tablet Take 1,000 mg by mouth 2 (two) times daily with a meal.   methimazole (TAPAZOLE)  10 MG tablet Take 10 mg by mouth daily with breakfast.   metoprolol tartrate (LOPRESSOR) 25 MG tablet Take 1 tablet (25 mg total) by mouth 2 (two) times daily.   TechLite Lancets MISC Inject 1 applicator into the skin daily as needed.   No facility-administered encounter medications on file as of 09/17/2022.   ALLERGIES: No Known Allergies  VACCINATION STATUS:  There is no immunization history on file for this patient.  HPI Allen Walton is 52 y.o. male who presents today with a medical history as above. he is being seen in f/u after he was seen in  consultation for hyperthyroidism requested by Delorise Shiner, MD. See notes from his prior visit.  Due to severe hyperthyroidism, he was kept on methimazole 10 mg p.o. daily during his last visit.  He returns with significant improvement clinically and biochemically.  He has gained some of the  weight he has lost previously, returning with 6 pounds of weight gain versus 40+ pounds of weight loss over the year.  He also reports significant improvement in his tremors, palpitations heat intolerance and anxiety.   He has comorbid conditions of hyperlipidemia, hypertension, complicated by at least 1 acute coronary syndrome.  He medications include apixaban, aspirin, atorvastatin, Jardiance, metformin, metoprolol, vitamin D, and vitamin B12.  He denies any family history of thyroid dysfunction or thyroid malignancy.  Review of Systems  Constitutional: + Weight gain, no fatigue, no subjective hyperthermia, no subjective hypothermia Eyes: no blurry vision, no xerophthalmia ENT: no sore throat, no nodules palpated in throat, no dysphagia/odynophagia, no hoarseness   Objective:       09/17/2022   10:22 AM 07/30/2022    9:25 AM 07/22/2022    9:35 AM  Vitals with BMI  Height '6\' 2"'$  '6\' 1"'$  '6\' 1"'$   Weight 194 lbs 191 lbs 6 oz 188 lbs 6 oz  BMI 24.9 123XX123 [id number]  Systolic 94 123XX123 [id number]  Diastolic 58 64 56  Pulse 56 68 60    BP (!) 94/58   Pulse (!) 56   Ht '6\' 2"'$  (1.88 m)   Wt 194 lb (88 kg)   BMI 24.91 kg/m   Wt Readings from Last 3 Encounters:  09/17/22 194 lb (88 kg)  07/30/22 191 lb 6.4 oz (86.8 kg)  07/22/22 188 lb 6.4 oz (85.5 kg)    Physical Exam  Constitutional:  Body mass index is 24.91 kg/m.,  not in acute distress, normal state of mind Eyes: PERRLA, EOMI, no exophthalmos ENT:  + Exophthalmos,   +moist mucous membranes, + gross thyromegaly, no gross cervical lymphadenopathy Cardiovascular: normal precordial activity, Regular Rate and Rhythm, no Murmur/Rubs/Gallops   CMP ( most recent) CMP     Component Value Date/Time   NA 135 12/02/2020 0807   K 3.9 12/02/2020 0807   CL 101 12/02/2020 0807   CO2 26 12/02/2020 0807   GLUCOSE 127 (H) 12/02/2020 0807   BUN 12 12/02/2020 0807   CREATININE 1.11 12/02/2020 0807   CALCIUM 8.7 (L) 12/02/2020 0807   PROT 6.1 (L)  11/29/2020 1415   ALBUMIN 3.3 (L) 11/29/2020 1415   AST 16 11/29/2020 1415   ALT 16 11/29/2020 1415   ALKPHOS 41 11/29/2020 1415   BILITOT 0.6 11/29/2020 1415   GFRNONAA >60 12/02/2020 0807     Diabetic Labs (most recent): Lab Results  Component Value Date   HGBA1C 7.3 (H) 11/29/2020     Lipid Panel ( most recent) Lipid Panel     Component Value  Date/Time   CHOL 107 12/02/2020 0434   TRIG 139 12/02/2020 0434   HDL 16 (L) 12/02/2020 0434   CHOLHDL 6.7 12/02/2020 0434   VLDL 28 12/02/2020 0434   LDLCALC 63 12/02/2020 0434     Recent Results (from the past 2160 hour(s))  TSH     Status: Abnormal   Collection Time: 07/22/22 10:24 AM  Result Value Ref Range   TSH <0.005 (L) 0.450 - 4.500 uIU/mL  T4, free     Status: None   Collection Time: 07/22/22 10:24 AM  Result Value Ref Range   Free T4 1.13 0.82 - 1.77 ng/dL  T3, free     Status: None   Collection Time: 07/22/22 10:24 AM  Result Value Ref Range   T3, Free 3.0 2.0 - 4.4 pg/mL  Thyroid peroxidase antibody     Status: None   Collection Time: 07/22/22 10:24 AM  Result Value Ref Range   Thyroperoxidase Ab SerPl-aCnc <9 0 - 34 IU/mL  Thyroglobulin antibody     Status: None   Collection Time: 07/22/22 10:24 AM  Result Value Ref Range   Thyroglobulin Antibody <1.0 0.0 - 0.9 IU/mL    Comment: Thyroglobulin Antibody measured by Beckman Coulter Methodology  TSH     Status: Abnormal   Collection Time: 09/10/22  9:12 AM  Result Value Ref Range   TSH 0.007 (L) 0.450 - 4.500 uIU/mL  T4, free     Status: None   Collection Time: 09/10/22  9:12 AM  Result Value Ref Range   Free T4 1.42 0.82 - 1.77 ng/dL  T3, free     Status: None   Collection Time: 09/10/22  9:12 AM  Result Value Ref Range   T3, Free 3.7 2.0 - 4.4 pg/mL     Assessment & Plan:   1. Hyperthyroidism   Based on these reviews, patient does have hyperthyroidism, has been on treatment at least since November 2023 with methimazole.  He has responded to  methimazole  close to euthyroid state.  He has not taken methimazole over the last 7 days. This is an opportunity for performing thyroid uptake and scan. If his scan shows significant activity, he will be considered for his next option of treatment with radioactive iodine thyroid ablation. Of note, this patient took amiodarone in the past for cardiac dysrhythmia.   If he returns with insignificant uptake, he will be observed or will be put back on low-dose methimazole.    Once his hyperthyroidism is taking care of, he will be considered for lifestyle medicine to help him address his chronic diseases.  - he is advised to maintain close follow up with Delorise Shiner, MD for primary care needs.   I spent  21  minutes in the care of the patient today including review of labs from Thyroid Function, CMP, and other relevant labs ; imaging/biopsy records (current and previous including abstractions from other facilities); face-to-face time discussing  his lab results and symptoms, medications doses, his options of short and long term treatment based on the latest standards of care / guidelines;   and documenting the encounter.  Allen Walton  participated in the discussions, expressed understanding, and voiced agreement with the above plans.  All questions were answered to his satisfaction. he is encouraged to contact clinic should he have any questions or concerns prior to his return visit.   Follow up plan: Return in about 10 days (around 09/27/2022) for F/U with Thyroid Uptake and Scan.  Glade Lloyd, MD [facility] [address] [place], [place] [address] Phone: [phone]  Fax: [phone]     09/17/2022, 2:02 PM  This note was partially dictated with voice recognition software. Similar sounding words can be transcribed inadequately or may not  be corrected upon review.

## 2022-09-30 ENCOUNTER — Encounter (HOSPITAL_COMMUNITY)
Admission: RE | Admit: 2022-09-30 | Discharge: 2022-09-30 | Disposition: A | Discharge status: Home / Self Care | Payer: No Typology Code available for payment source | Source: Ambulatory Visit | Attending: "Endocrinology | Admitting: "Endocrinology

## 2022-09-30 DIAGNOSIS — E0590 Thyrotoxicosis, unspecified without thyrotoxic crisis or storm: Secondary | ICD-10-CM | POA: Insufficient documentation

## 2022-09-30 MED ORDER — SODIUM IODIDE I-123 7.4 MBQ CAPS
450.0000 | ORAL_CAPSULE | Freq: Once | ORAL | Status: AC
  Administered 2022-09-30: 450 via ORAL

## 2022-10-01 ENCOUNTER — Encounter (HOSPITAL_COMMUNITY)
Admission: RE | Admit: 2022-10-01 | Discharge: 2022-10-01 | Disposition: A | Discharge status: Home / Self Care | Payer: No Typology Code available for payment source | Source: Ambulatory Visit | Attending: "Endocrinology | Admitting: "Endocrinology

## 2022-10-06 ENCOUNTER — Encounter: Payer: Self-pay | Admitting: "Endocrinology

## 2022-10-06 ENCOUNTER — Ambulatory Visit (INDEPENDENT_AMBULATORY_CARE_PROVIDER_SITE_OTHER): Payer: No Typology Code available for payment source | Admitting: "Endocrinology

## 2022-10-06 VITALS — BP 92/62 | HR 64 | Ht 74.0 in | Wt 188.2 lb

## 2022-10-06 DIAGNOSIS — E0590 Thyrotoxicosis, unspecified without thyrotoxic crisis or storm: Secondary | ICD-10-CM

## 2022-10-06 NOTE — Progress Notes (Signed)
Endocrinology follow-up note                                             10/06/2022, 1:48 PM   Subjective:    Patient ID: Allen Walton, male    DOB: [birth year], PCP Delorise Shiner, MD   Past Medical History:  Diagnosis Date   Anginal pain ([place])    Diabetes mellitus without complication ([place])    Myocardial infarction [facility])    Past Surgical History:  Procedure Laterality Date   CORONARY ARTERY BYPASS GRAFT N/A 11/29/2020   Procedure: CORONARY ARTERY BYPASS GRAFTING (CABG) TIMES ONE, ON PUMP, USING LEFT INTERNAL MAMMARY ARTERY;  Surgeon: Gaye Pollack, MD;  Location: [place];  Service: Open Heart Surgery;  Laterality: N/A;   CORONARY/GRAFT ACUTE MI REVASCULARIZATION N/A 11/29/2020   Procedure: CORONARY/GRAFT ACUTE MI REVASCULARIZATION;  Surgeon: Lorretta Harp, MD;  Location: [place] [facility];  Service: Cardiovascular;  Laterality: N/A;   HERNIA REPAIR     TEE WITHOUT CARDIOVERSION N/A 11/29/2020   Procedure: TRANSESOPHAGEAL ECHOCARDIOGRAM (TEE);  Surgeon: Gaye Pollack, MD;  Location: [place];  Service: Open Heart Surgery;  Laterality: N/A;   Social History   Socioeconomic History   Marital status: Married    Spouse name: Not on file   Number of children: Not on file   Years of education: Not on file   Highest education level: Not on file  Occupational History   Not on file  Tobacco Use   Smoking status: Former    Types: Cigarettes   Smokeless tobacco: Never  Vaping Use   Vaping Use: Never used  Substance and Sexual Activity   Alcohol use: Never   Drug use: Never   Sexual activity: Not on file  Other Topics Concern   Not on file  Social History Narrative   Not on file   Social Determinants of Health   Financial Resource Strain: Not on file  Food Insecurity: Not on file  Transportation Needs: Not on file  Physical Activity: Not on file  Stress: Not on file  Social Connections: Not on file   Family History  Problem Relation Age of Onset    Hypertension Father    Outpatient Encounter Medications as of 10/06/2022  Medication Sig   amiodarone (PACERONE) 200 MG tablet Take 2 tablets (400 mg total) by mouth 2 (two) times daily for 5 days then reduce the dose to 1 tablet (200mg ) by mouth twice daily. (Patient not taking: Reported on 07/30/2022)   apixaban (ELIQUIS) 5 MG TABS tablet Take 5 mg by mouth 2 (two) times daily.   aspirin 81 MG EC tablet Take 1 tablet (81 mg total) by mouth daily. Swallow whole.   atorvastatin (LIPITOR) 80 MG tablet Take 1 tablet (80 mg total) by mouth daily.   Cholecalciferol (VITAMIN D-1000 MAX ST) 25 MCG (1000 UT) tablet Take 1,000 Units by mouth daily. (Patient not taking: Reported on 07/30/2022)   clopidogrel (PLAVIX) 75 MG tablet Take 1 tablet (75 mg total) by mouth daily. (Patient not taking: Reported on 07/30/2022)   diclofenac Sodium (VOLTAREN) 1 % GEL Apply topically.   empagliflozin (JARDIANCE) 25 MG TABS tablet Take 25 mg by mouth daily.   metFORMIN (GLUCOPHAGE) 1000 MG tablet Take 1,000 mg by mouth 2 (two) times daily with a meal.   methimazole (TAPAZOLE)  10 MG tablet Take 10 mg by mouth daily with breakfast.   metoprolol tartrate (LOPRESSOR) 25 MG tablet Take 1 tablet (25 mg total) by mouth 2 (two) times daily.   TechLite Lancets MISC Inject 1 applicator into the skin daily as needed.   No facility-administered encounter medications on file as of 10/06/2022.   ALLERGIES: No Known Allergies  VACCINATION STATUS:  There is no immunization history on file for this patient.  HPI Allen Walton is 52 y.o. male who presents today with a medical history as above. he is being seen in f/u after he was seen in  consultation for hyperthyroidism requested by Delorise Shiner, MD. See notes from his prior visit.  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 He was advised to stay off of methimazole, returns with 6 lbs of weight loss.    He has comorbid  conditions of hyperlipidemia, hypertension, complicated by at least 1 acute coronary syndrome.  He medications include apixaban, aspirin, atorvastatin, Jardiance, metformin, metoprolol, vitamin D, and vitamin B12.  He denies any family history of thyroid dysfunction or thyroid malignancy.  Review of Systems  Constitutional: + Weight loss, no fatigue, no subjective hyperthermia, no subjective hypothermia Eyes: no blurry vision, no xerophthalmia ENT: no sore throat, no nodules palpated in throat, no dysphagia/odynophagia, no hoarseness   Objective:       10/06/2022    8:28 AM 09/17/2022   10:22 AM 07/30/2022    9:25 AM  Vitals with BMI  Height 6\' 2"  6\' 2"  6\' 1"   Weight 188 lbs 3 oz 194 lbs 191 lbs 6 oz  BMI 24.15 99991111 [id number]  Systolic 92 94 123XX123  Diastolic 62 58 64  Pulse 64 56 68    BP 92/62   Pulse 64   Ht 6\' 2"  (1.88 m)   Wt 188 lb 3.2 oz (85.4 kg)   BMI 24.16 kg/m   Wt Readings from Last 3 Encounters:  10/06/22 188 lb 3.2 oz (85.4 kg)  09/17/22 194 lb (88 kg)  07/30/22 191 lb 6.4 oz (86.8 kg)    Physical Exam  Constitutional:  Body mass index is 24.16 kg/m.,  not in acute distress, normal state of mind Eyes: PERRLA, EOMI, no exophthalmos ENT:  + Exophthalmos,   +moist mucous membranes, + gross thyromegaly, no gross cervical lymphadenopathy Cardiovascular: normal precordial activity, Regular Rate and Rhythm, no Murmur/Rubs/Gallops   CMP ( most recent) CMP     Component Value Date/Time   NA 135 12/02/2020 0807   K 3.9 12/02/2020 0807   CL 101 12/02/2020 0807   CO2 26 12/02/2020 0807   GLUCOSE 127 (H) 12/02/2020 0807   BUN 12 12/02/2020 0807   CREATININE 1.11 12/02/2020 0807   CALCIUM 8.7 (L) 12/02/2020 0807   PROT 6.1 (L) 11/29/2020 1415   ALBUMIN 3.3 (L) 11/29/2020 1415   AST 16 11/29/2020 1415   ALT 16 11/29/2020 1415   ALKPHOS 41 11/29/2020 1415   BILITOT 0.6 11/29/2020 1415   GFRNONAA >60 12/02/2020 0807     Diabetic Labs (most recent): Lab Results   Component Value Date   HGBA1C 7.3 (H) 11/29/2020     Lipid Panel ( most recent) Lipid Panel     Component Value Date/Time   CHOL 107 12/02/2020 0434   TRIG 139 12/02/2020 0434   HDL 16 (L) 12/02/2020 0434   CHOLHDL 6.7 12/02/2020 0434   VLDL 28 12/02/2020 0434   LDLCALC 63 12/02/2020  0434     Recent Results (from the past 2160 hour(s))  TSH     Status: Abnormal   Collection Time: 07/22/22 10:24 AM  Result Value Ref Range   TSH <0.005 (L) 0.450 - 4.500 uIU/mL  T4, free     Status: None   Collection Time: 07/22/22 10:24 AM  Result Value Ref Range   Free T4 1.13 0.82 - 1.77 ng/dL  T3, free     Status: None   Collection Time: 07/22/22 10:24 AM  Result Value Ref Range   T3, Free 3.0 2.0 - 4.4 pg/mL  Thyroid peroxidase antibody     Status: None   Collection Time: 07/22/22 10:24 AM  Result Value Ref Range   Thyroperoxidase Ab SerPl-aCnc <9 0 - 34 IU/mL  Thyroglobulin antibody     Status: None   Collection Time: 07/22/22 10:24 AM  Result Value Ref Range   Thyroglobulin Antibody <1.0 0.0 - 0.9 IU/mL    Comment: Thyroglobulin Antibody measured by Beckman Coulter Methodology  TSH     Status: Abnormal   Collection Time: 09/10/22  9:12 AM  Result Value Ref Range   TSH 0.007 (L) 0.450 - 4.500 uIU/mL  T4, free     Status: None   Collection Time: 09/10/22  9:12 AM  Result Value Ref Range   Free T4 1.42 0.82 - 1.77 ng/dL  T3, free     Status: None   Collection Time: 09/10/22  9:12 AM  Result Value Ref Range   T3, Free 3.7 2.0 - 4.4 pg/mL    Thyroid uptake and scan FINDINGS: Homogeneous tracer distribution in both thyroid lobes.   No focal areas of increased or decreased tracer localization.   4 hour I-123 uptake = 46.8% (normal 5-20%),  24 hour I-123 uptake = 58.7% (normal 10-30%)   IMPRESSION: Normal thyroid scan with elevated 4 hour and 24 hour radio iodine uptakes.  Findings consistent with Graves disease.  Assessment & Plan:   1. Hyperthyroidism  2. 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 Risks and complications of untreated hyperthyroidism are discussed with him.  He was treated briefly with  methimazole  with clinical response before. I discussed his options with him. He  agrees with my recommendation of RAI as his best option at this time. This treatment will be schedule to be done at [facility] nuclear medicine. -She is made aware of the possibility of RAI induced hypothyroidism which will need lifelong replacement with thyroid hormones.  Of note, this patient took amiodarone in the past for cardiac dysrhythmia.   Once his hyperthyroidism is taking care of, he will be considered for lifestyle medicine to help him address his chronic diseases, such as type 2 DM and HTN.  - he is advised to maintain close follow up with Delorise Shiner, MD for primary care needs.   I spent  21 minutes in the care of the patient today including review of labs from Thyroid Function, CMP, and other relevant labs ; imaging/biopsy records (current and previous including abstractions from other facilities); face-to-face time discussing  his lab results and symptoms, medications doses, his options of short and long term treatment based on the latest standards of care / guidelines;   and documenting the encounter.  Allen Walton  participated in the discussions, expressed understanding, and voiced agreement with the above plans.  All questions were answered to his satisfaction. he is encouraged  to contact clinic should he have any questions or concerns prior to his return visit.   Follow up plan: Return in about 9 weeks (around 12/08/2022) for F/U with Labs after I131 Therapy.   Glade Lloyd, MD [facility] [address] [place], [place] [address] Phone: [phone]  Fax: [phone]     10/06/2022, 1:48 PM  This note was partially dictated  with voice recognition software. Similar sounding words can be transcribed inadequately or may not  be corrected upon review.

## 2022-10-27 NOTE — Written Directive (Addendum)
MOLECULAR IMAGING AND THERAPEUTICS WRITTEN DIRECTIVE   PATIENT NAME: Allen Walton  PT DOB:   03-30-1971                                              MRN: [id number]  ---------------------------------------------------------------------------------------------------------------------   I-131 WHOLE THYROID THERAPY (NON-CANCER)    RADIOPHARMACEUTICAL:   Iodine-131 Capsule    PRESCRIBED DOSE FOR ADMINISTRATION: 15 mCi   ROUTE OFADMINISTRATION: PO   DIAGNOSIS:  Graves Disease   REFERRING PHYSICIAN: Dr. Fransico Him   TSH:    Lab Results  Component Value Date   TSH 0.007 (L) 09/10/2022   TSH <0.005 (L) 07/22/2022     PRIOR I-131 THERAPY (Date and Dose):   PRIOR RADIOLOGY EXAMS (Results and Date): NM THYROID MULT UPTAKE W/IMAGING  Result Date: 10/01/2022 CLINICAL DATA:  Hyperthyroidism EXAM: THYROID SCAN AND UPTAKE - 4 AND 24 HOURS TECHNIQUE: Following oral administration of I-123 capsule, anterior planar imaging was acquired at 24 hours. Thyroid uptake was calculated with a thyroid probe at 4-6 hours and 24 hours. RADIOPHARMACEUTICALS:  450 uCi I-123 sodium iodide p.o. COMPARISON:  None Available. FINDINGS: Homogeneous tracer distribution in both thyroid lobes. No focal areas of increased or decreased tracer localization. 4 hour I-123 uptake = 46.8% (normal 5-20%) 24 hour I-123 uptake = 58.7% (normal 10-30%) IMPRESSION: Normal thyroid scan with elevated 4 hour and 24 hour radio iodine uptakes. Findings consistent with Graves disease. Electronically Signed   By: Ulyses Southward M.D.   On: 10/01/2022 10:13      ADDITIONAL PHYSICIAN COMMENTS/NOTES  Imaging findings and TSH value c/w Graves disease.   AUTHORIZED USER SIGNATURE & TIME STAMP: Patriciaann Clan, MD   10/27/22    12:32 PM

## 2022-10-28 ENCOUNTER — Encounter (HOSPITAL_COMMUNITY)
Admission: RE | Admit: 2022-10-28 | Discharge: 2022-10-28 | Disposition: A | Discharge status: Home / Self Care | Payer: No Typology Code available for payment source | Source: Ambulatory Visit | Attending: "Endocrinology | Admitting: "Endocrinology

## 2022-10-28 DIAGNOSIS — E0500 Thyrotoxicosis with diffuse goiter without thyrotoxic crisis or storm: Secondary | ICD-10-CM | POA: Insufficient documentation

## 2022-10-28 DIAGNOSIS — E0590 Thyrotoxicosis, unspecified without thyrotoxic crisis or storm: Secondary | ICD-10-CM | POA: Insufficient documentation

## 2022-10-28 MED ORDER — SODIUM IODIDE I 131 CAPSULE
15.4000 | Freq: Once | INTRAVENOUS | Status: AC | PRN
  Administered 2022-10-28: 15.4 via ORAL

## 2022-12-09 LAB — T4, FREE: Free T4: 0.62 ng/dL — ABNORMAL LOW (ref 0.82–1.77)

## 2022-12-09 LAB — TSH: TSH: 0.241 u[IU]/mL — ABNORMAL LOW (ref 0.450–4.500)

## 2022-12-09 LAB — T3, FREE: T3, Free: 1.6 pg/mL — ABNORMAL LOW (ref 2.0–4.4)

## 2022-12-15 ENCOUNTER — Encounter: Payer: Self-pay | Admitting: "Endocrinology

## 2022-12-15 ENCOUNTER — Ambulatory Visit (INDEPENDENT_AMBULATORY_CARE_PROVIDER_SITE_OTHER): Payer: No Typology Code available for payment source | Admitting: "Endocrinology

## 2022-12-15 VITALS — BP 104/60 | HR 44 | Ht 74.0 in | Wt 204.4 lb

## 2022-12-15 DIAGNOSIS — Z7984 Long term (current) use of oral hypoglycemic drugs: Secondary | ICD-10-CM

## 2022-12-15 DIAGNOSIS — I10 Essential (primary) hypertension: Secondary | ICD-10-CM | POA: Diagnosis not present

## 2022-12-15 DIAGNOSIS — E782 Mixed hyperlipidemia: Secondary | ICD-10-CM | POA: Diagnosis not present

## 2022-12-15 DIAGNOSIS — E1165 Type 2 diabetes mellitus with hyperglycemia: Secondary | ICD-10-CM

## 2022-12-15 DIAGNOSIS — E890 Postprocedural hypothyroidism: Secondary | ICD-10-CM

## 2022-12-15 MED ORDER — LEVOTHYROXINE SODIUM 75 MCG PO TABS: 75.0000 ug | ORAL_TABLET | Freq: Every day | ORAL | 0 refills | Status: DC

## 2022-12-15 NOTE — Progress Notes (Signed)
Endocrinology follow-up note                                             12/15/2022, 11:22 AM   Subjective:    Patient ID: Allen Walton, male    DOB: 1971-02-23, PCP Concha Pyo, MD   Past Medical History:  Diagnosis Date   Anginal pain (HCC)    Diabetes mellitus without complication (HCC)    Myocardial infarction [facility])    Past Surgical History:  Procedure Laterality Date   CORONARY ARTERY BYPASS GRAFT N/A 11/29/2020   Procedure: CORONARY ARTERY BYPASS GRAFTING (CABG) TIMES ONE, ON PUMP, USING LEFT INTERNAL MAMMARY ARTERY;  Surgeon: Alleen Borne, MD;  Location: [facility] OR;  Service: Open Heart Surgery;  Laterality: N/A;   CORONARY/GRAFT ACUTE MI REVASCULARIZATION N/A 11/29/2020   Procedure: CORONARY/GRAFT ACUTE MI REVASCULARIZATION;  Surgeon: Runell Gess, MD;  Location: [facility] INVASIVE CV LAB;  Service: Cardiovascular;  Laterality: N/A;   HERNIA REPAIR     TEE WITHOUT CARDIOVERSION N/A 11/29/2020   Procedure: TRANSESOPHAGEAL ECHOCARDIOGRAM (TEE);  Surgeon: Alleen Borne, MD;  Location: [facility] OR;  Service: Open Heart Surgery;  Laterality: N/A;   Social History   Socioeconomic History   Marital status: Married    Spouse name: Not on file   Number of children: Not on file   Years of education: Not on file   Highest education level: Not on file  Occupational History   Not on file  Tobacco Use   Smoking status: Former    Types: Cigarettes   Smokeless tobacco: Never  Vaping Use   Vaping Use: Never used  Substance and Sexual Activity   Alcohol use: Never   Drug use: Never   Sexual activity: Not on file  Other Topics Concern   Not on file  Social History Narrative   Not on file   Social Determinants of Health   Financial Resource Strain: Not on file  Food Insecurity: Not on file  Transportation Needs: Not on file  Physical Activity: Not on file  Stress: Not on file  Social Connections: Not on file   Family History  Problem Relation Age of Onset    Hypertension Father    Outpatient Encounter Medications as of 12/15/2022  Medication Sig   levothyroxine (SYNTHROID) 75 MCG tablet Take 1 tablet (75 mcg total) by mouth daily before breakfast.   apixaban (ELIQUIS) 5 MG TABS tablet Take 5 mg by mouth 2 (two) times daily.   aspirin 81 MG EC tablet Take 1 tablet (81 mg total) by mouth daily. Swallow whole.   atorvastatin (LIPITOR) 80 MG tablet Take 1 tablet (80 mg total) by mouth daily.   diclofenac Sodium (VOLTAREN) 1 % GEL Apply topically.   empagliflozin (JARDIANCE) 25 MG TABS tablet Take 25 mg by mouth daily.   metFORMIN (GLUCOPHAGE) 1000 MG tablet Take 1,000 mg by mouth 2 (two) times daily with a meal.   metoprolol tartrate (LOPRESSOR) 25 MG tablet Take 1 tablet (25 mg total) by mouth 2 (two) times daily.   TechLite Lancets MISC Inject 1 applicator into the skin daily as needed.   [DISCONTINUED] amiodarone (PACERONE) 200 MG tablet Take 2 tablets (400 mg total) by mouth 2 (two) times daily for 5 days then reduce the dose to 1 tablet (200mg ) by mouth twice daily. (Patient not taking:  Reported on 07/30/2022)   [DISCONTINUED] Cholecalciferol (VITAMIN D-1000 MAX ST) 25 MCG (1000 UT) tablet Take 1,000 Units by mouth daily. (Patient not taking: Reported on 07/30/2022)   [DISCONTINUED] clopidogrel (PLAVIX) 75 MG tablet Take 1 tablet (75 mg total) by mouth daily. (Patient not taking: Reported on 07/30/2022)   No facility-administered encounter medications on file as of 12/15/2022.   ALLERGIES: No Known Allergies  VACCINATION STATUS:  There is no immunization history on file for this patient.  HPI Allen Walton is 52 y.o. male who presents today with a medical history as above. he is being seen in f/u after he was seen in  consultation for hyperthyroidism requested by Concha Pyo, MD. See notes from his prior visit.  -After appropriate workup showed a diagnosis of primary hyperthyroidism from Graves' disease, he was given radioactive iodine thyroid  ablation on October 28, 2022.  His subsequent labs on Dec 08, 2022 showed evidence of treatment with hypothyroidism.  He returns with 16 pounds of weight gain.  He feels better, has no new complaints today.    He has comorbid conditions of hyperlipidemia, hypertension, complicated by at least 1 acute coronary syndrome.  He medications include apixaban, aspirin, atorvastatin, Jardiance, metformin, metoprolol, vitamin D, and vitamin B12.  He denies any family history of thyroid dysfunction or thyroid malignancy.  Review of Systems  Constitutional: + Weight gain, no fatigue, no subjective hyperthermia, no subjective hypothermia Eyes: no blurry vision, no xerophthalmia ENT: no sore throat, no nodules palpated in throat, no dysphagia/odynophagia, no hoarseness   Objective:       12/15/2022    8:25 AM 10/06/2022    8:28 AM 09/17/2022   10:22 AM  Vitals with BMI  Height 6\' 2"  6\' 2"  6\' 2"   Weight 204 lbs 6 oz 188 lbs 3 oz 194 lbs  BMI 26.23 24.15 24.9  Systolic 104 92 94  Diastolic 60 62 58  Pulse 44 64 56    BP 104/60   Pulse (!) 44   Ht 6\' 2"  (1.88 m)   Wt 204 lb 6.4 oz (92.7 kg)   BMI 26.24 kg/m   Wt Readings from Last 3 Encounters:  12/15/22 204 lb 6.4 oz (92.7 kg)  10/06/22 188 lb 3.2 oz (85.4 kg)  09/17/22 194 lb (88 kg)    Physical Exam  Constitutional:  Body mass index is 26.24 kg/m.,  not in acute distress, normal state of mind Eyes: PERRLA, EOMI, no exophthalmos ENT:  + Exophthalmos,   +moist mucous membranes, + gross thyromegaly, no gross cervical lymphadenopathy Cardiovascular: normal precordial activity, Regular Rate and Rhythm, no Murmur/Rubs/Gallops   CMP ( most recent) CMP     Component Value Date/Time   NA 135 12/02/2020 0807   K 3.9 12/02/2020 0807   CL 101 12/02/2020 0807   CO2 26 12/02/2020 0807   GLUCOSE 127 (H) 12/02/2020 0807   BUN 12 12/02/2020 0807   CREATININE 1.11 12/02/2020 0807   CALCIUM 8.7 (L) 12/02/2020 0807   PROT 6.1 (L) 11/29/2020 1415    ALBUMIN 3.3 (L) 11/29/2020 1415   AST 16 11/29/2020 1415   ALT 16 11/29/2020 1415   ALKPHOS 41 11/29/2020 1415   BILITOT 0.6 11/29/2020 1415   GFRNONAA >60 12/02/2020 0807     Diabetic Labs (most recent): Lab Results  Component Value Date   HGBA1C 7.3 (H) 11/29/2020     Lipid Panel ( most recent) Lipid Panel     Component Value Date/Time   CHOL 107  12/02/2020 0434   TRIG 139 12/02/2020 0434   HDL 16 (L) 12/02/2020 0434   CHOLHDL 6.7 12/02/2020 0434   VLDL 28 12/02/2020 0434   LDLCALC 63 12/02/2020 0434     Recent Results (from the past 2160 hour(s))  TSH     Status: Abnormal   Collection Time: 12/08/22 10:32 AM  Result Value Ref Range   TSH 0.241 (L) 0.450 - 4.500 uIU/mL  T4, free     Status: Abnormal   Collection Time: 12/08/22 10:32 AM  Result Value Ref Range   Free T4 0.62 (L) 0.82 - 1.77 ng/dL  T3, free     Status: Abnormal   Collection Time: 12/08/22 10:32 AM  Result Value Ref Range   T3, Free 1.6 (L) 2.0 - 4.4 pg/mL    Thyroid uptake and scan FINDINGS: Homogeneous tracer distribution in both thyroid lobes.   No focal areas of increased or decreased tracer localization.   4 hour I-123 uptake = 46.8% (normal 5-20%),  24 hour I-123 uptake = 58.7% (normal 10-30%)   IMPRESSION: Normal thyroid scan with elevated 4 hour and 24 hour radio iodine uptakes.  Findings consistent with Graves disease.   October 28, 2022 RADIOPHARMACEUTICALS:  15.4 mCi I-131 sodium iodide orally   IMPRESSION: Per oral administration of I-131 sodium iodide for the treatment of Graves disease.    Assessment & Plan:   1.  RAI induced hypothyroidism  2. Graves Disease  This patient is status post RAI thyroid ablation for hyperthyroidism due to Graves' disease. He is ready for initiation of thyroid hormone replacement.  I discussed and initiated levothyroxine 75 mcg p.o. daily before breakfast.   - We discussed about the correct intake of his thyroid hormone, on empty stomach  at fasting, with water, separated by at least 30 minutes from breakfast and other medications,  and separated by more than 4 hours from calcium, iron, multivitamins, acid reflux medications (PPIs). -Patient is made aware of the fact that thyroid hormone replacement is needed for life, dose to be adjusted by periodic monitoring of thyroid function tests.  Of note, this patient took amiodarone in the past for cardiac dysrhythmia.   He has chronic conditions including type 2 diabetes, hyperlipidemia and hypertension.  He will be considered for lifestyle medicine on subsequent visits.  He is advised to continue on his current medications including atorvastatin 80 mg p.o. daily at bedtime, Jardiance 25 mg p.o. daily at breakfast, metformin 1000 mg p.o. twice daily, metoprolol 25 mg p.o. twice daily.     - he is advised to maintain close follow up with Concha Pyo, MD for primary care needs.   I spent  25  minutes in the care of the patient today including review of labs from Thyroid Function, CMP, and other relevant labs ; imaging/biopsy records (current and previous including abstractions from other facilities); face-to-face time discussing  his lab results and symptoms, medications doses, his options of short and long term treatment based on the latest standards of care / guidelines;   and documenting the encounter.  Allen Walton  participated in the discussions, expressed understanding, and voiced agreement with the above plans.  All questions were answered to his satisfaction. he is encouraged to contact clinic should he have any questions or concerns prior to his return visit.    Follow up plan: Return in about 3 months (around 03/17/2023) for F/U with Pre-visit Labs.   Marquis Lunch, MD [facility] [phone]  [address] [place], [place] [address] Phone: [phone]  Fax: [phone]     12/15/2022, 11:22 AM  This note was partially dictated  with voice recognition software. Similar sounding words can be transcribed inadequately or may not  be corrected upon review.

## 2023-03-23 ENCOUNTER — Other Ambulatory Visit: Payer: Self-pay

## 2023-03-23 DIAGNOSIS — E890 Postprocedural hypothyroidism: Secondary | ICD-10-CM

## 2023-03-23 MED ORDER — LEVOTHYROXINE SODIUM 75 MCG PO TABS
75.0000 ug | ORAL_TABLET | Freq: Every day | ORAL | 0 refills | Status: DC
Start: 2023-03-23 — End: 2023-03-29

## 2023-03-29 ENCOUNTER — Encounter: Payer: Self-pay | Admitting: "Endocrinology

## 2023-03-29 ENCOUNTER — Ambulatory Visit (INDEPENDENT_AMBULATORY_CARE_PROVIDER_SITE_OTHER): Payer: No Typology Code available for payment source | Admitting: "Endocrinology

## 2023-03-29 VITALS — BP 92/70 | HR 60 | Ht 74.0 in | Wt 205.2 lb

## 2023-03-29 DIAGNOSIS — E890 Postprocedural hypothyroidism: Secondary | ICD-10-CM | POA: Diagnosis not present

## 2023-03-29 DIAGNOSIS — E1165 Type 2 diabetes mellitus with hyperglycemia: Secondary | ICD-10-CM

## 2023-03-29 DIAGNOSIS — Z7984 Long term (current) use of oral hypoglycemic drugs: Secondary | ICD-10-CM

## 2023-03-29 DIAGNOSIS — E782 Mixed hyperlipidemia: Secondary | ICD-10-CM | POA: Diagnosis not present

## 2023-03-29 MED ORDER — LEVOTHYROXINE SODIUM 100 MCG PO TABS: 100.0000 ug | ORAL_TABLET | Freq: Every day | ORAL | 1 refills | Status: DC

## 2023-03-29 NOTE — Progress Notes (Signed)
Endocrinology follow-up note                                             03/29/2023, 8:34 AM   Subjective:    Patient ID: Allen Walton, male    DOB: [birth year], PCP Concha Pyo, MD   Past Medical History:  Diagnosis Date   Anginal pain (HCC)    Diabetes mellitus without complication (HCC)    Myocardial infarction [facility])    Past Surgical History:  Procedure Laterality Date   CORONARY ARTERY BYPASS GRAFT N/A 11/29/2020   Procedure: CORONARY ARTERY BYPASS GRAFTING (CABG) TIMES ONE, ON PUMP, USING LEFT INTERNAL MAMMARY ARTERY;  Surgeon: Alleen Borne, MD;  Location: [facility] OR;  Service: Open Heart Surgery;  Laterality: N/A;   CORONARY/GRAFT ACUTE MI REVASCULARIZATION N/A 11/29/2020   Procedure: CORONARY/GRAFT ACUTE MI REVASCULARIZATION;  Surgeon: Runell Gess, MD;  Location: [facility] INVASIVE CV LAB;  Service: Cardiovascular;  Laterality: N/A;   HERNIA REPAIR     TEE WITHOUT CARDIOVERSION N/A 11/29/2020   Procedure: TRANSESOPHAGEAL ECHOCARDIOGRAM (TEE);  Surgeon: Alleen Borne, MD;  Location: [facility] OR;  Service: Open Heart Surgery;  Laterality: N/A;   Social History   Socioeconomic History   Marital status: Married    Spouse name: Not on file   Number of children: Not on file   Years of education: Not on file   Highest education level: Not on file  Occupational History   Not on file  Tobacco Use   Smoking status: Former    Types: Cigarettes   Smokeless tobacco: Never  Vaping Use   Vaping status: Never Used  Substance and Sexual Activity   Alcohol use: Never   Drug use: Never   Sexual activity: Not on file  Other Topics Concern   Not on file  Social History Narrative   Not on file   Social Determinants of Health   Financial Resource Strain: Low Risk  (05/18/2021)   Received from [facility], [facility]   Overall Financial Resource Strain (CARDIA)    Difficulty of Paying Living Expenses: Not hard at all  Food Insecurity: Not on file  Transportation  Needs: No Transportation Needs (05/19/2021)   Received from [facility], [facility]   PRAPARE - Transportation    Lack of Transportation (Medical): No    Lack of Transportation (Non-Medical): No  Physical Activity: Not on file  Stress: No Stress Concern Present (05/18/2021)   Received from [facility], [facility] of Occupational Health - Occupational Stress Questionnaire    Feeling of Stress : Not at all  Social Connections: Unknown (11/17/2021)   Received from [facility], [facility]   Social Network    Social Network: Not on file   Family History  Problem Relation Age of Onset   Hypertension Father    Outpatient Encounter Medications as of 03/29/2023  Medication Sig   apixaban (ELIQUIS) 5 MG TABS tablet Take 5 mg by mouth 2 (two) times daily.   aspirin 81 MG EC tablet Take 1 tablet (81 mg total) by mouth daily. Swallow whole.   atorvastatin (LIPITOR) 80 MG tablet Take 1 tablet (80 mg total) by mouth daily.   diclofenac Sodium (VOLTAREN) 1 % GEL Apply topically.   Empagliflozin-metFORMIN HCl ER 25-1000 MG TB24 Take 1 tablet by mouth 2 (two)  times daily.   levothyroxine (SYNTHROID) 100 MCG tablet Take 1 tablet (100 mcg total) by mouth daily before breakfast.   metoprolol tartrate (LOPRESSOR) 25 MG tablet Take 1 tablet (25 mg total) by mouth 2 (two) times daily.   TechLite Lancets MISC Inject 1 applicator into the skin daily as needed.   [DISCONTINUED] empagliflozin (JARDIANCE) 25 MG TABS tablet Take 25 mg by mouth daily.   [DISCONTINUED] levothyroxine (SYNTHROID) 75 MCG tablet Take 1 tablet (75 mcg total) by mouth daily before breakfast.   [DISCONTINUED] metFORMIN (GLUCOPHAGE) 1000 MG tablet Take 1,000 mg by mouth 2 (two) times daily with a meal.   No facility-administered encounter medications on file as of 03/29/2023.   ALLERGIES: No Known Allergies  VACCINATION STATUS:  There is no immunization history on file for this patient.  HPI Allen Walton is 52 y.o. male who presents today with a medical history as above. he is being seen in f/u after he was seen in  consultation for hyperthyroidism requested by Concha Pyo, MD. See notes from his prior visit.  -After appropriate workup showed a diagnosis of primary hyperthyroidism from Graves' disease, he was given radioactive iodine thyroid ablation on October 28, 2022.  -He responded to treatment with subsequent RAI induced hypothyroidism for which he was initiated on levothyroxine 75 mcg p.o. daily before breakfast when his last visit.  He returns previsit labs consistent with under replacement.  He continues to feel better, gained a few pounds of the significant weight loss he did have before he was treated.     He has comorbid conditions of hyperlipidemia, hypertension, complicated by at least 1 acute coronary syndrome.  His medications include apixaban, aspirin, atorvastatin, Jardiance, metformin, metoprolol, vitamin D, and vitamin B12.  He denies any family history of thyroid dysfunction or thyroid malignancy.  Review of Systems  Constitutional: + Weight gain, no fatigue, no subjective hyperthermia, no subjective hypothermia    Objective:       03/29/2023    8:22 AM 12/15/2022    8:25 AM 10/06/2022    8:28 AM  Vitals with BMI  Height 6\' 2"  6\' 2"  6\' 2"   Weight 205 lbs 3 oz 204 lbs 6 oz 188 lbs 3 oz  BMI 26.33 26.23 24.15  Systolic 92 104 92  Diastolic 70 60 62  Pulse 60 44 64    BP 92/70   Pulse 60   Ht 6\' 2"  (1.88 m)   Wt 205 lb 3.2 oz (93.1 kg)   BMI 26.35 kg/m   Wt Readings from Last 3 Encounters:  03/29/23 205 lb 3.2 oz (93.1 kg)  12/15/22 204 lb 6.4 oz (92.7 kg)  10/06/22 188 lb 3.2 oz (85.4 kg)    Physical Exam  Constitutional:  Body mass index is 26.35 kg/m.,  not in acute distress, normal state of mind Eyes: PERRLA, EOMI, no exophthalmos ENT:  + Exophthalmos,   +moist mucous membranes, + gross thyromegaly, no gross cervical lymphadenopathy   CMP (  most recent) CMP     Component Value Date/Time   NA 135 12/02/2020 0807   K 3.9 12/02/2020 0807   CL 101 12/02/2020 0807   CO2 26 12/02/2020 0807   GLUCOSE 127 (H) 12/02/2020 0807   BUN 12 12/02/2020 0807   CREATININE 1.11 12/02/2020 0807   CALCIUM 8.7 (L) 12/02/2020 0807   PROT 6.1 (L) 11/29/2020 1415   ALBUMIN 3.3 (L) 11/29/2020 1415   AST 16 11/29/2020 1415   ALT 16 11/29/2020 1415  ALKPHOS 41 11/29/2020 1415   BILITOT 0.6 11/29/2020 1415   GFRNONAA >60 12/02/2020 0807     Diabetic Labs (most recent): Lab Results  Component Value Date   HGBA1C 7.3 (H) 11/29/2020     Lipid Panel     Component Value Date/Time   CHOL 107 12/02/2020 0434   TRIG 139 12/02/2020 0434   HDL 16 (L) 12/02/2020 0434   CHOLHDL 6.7 12/02/2020 0434   VLDL 28 12/02/2020 0434   LDLCALC 63 12/02/2020 0434     Recent Results (from the past 2160 hour(s))  TSH     Status: Abnormal   Collection Time: 03/10/23  8:40 AM  Result Value Ref Range   TSH 20.200 (H) 0.450 - 4.500 uIU/mL  T4, free     Status: None   Collection Time: 03/10/23  8:40 AM  Result Value Ref Range   Free T4 1.36 0.82 - 1.77 ng/dL    Thyroid uptake and scan FINDINGS: Homogeneous tracer distribution in both thyroid lobes.   No focal areas of increased or decreased tracer localization.   4 hour I-123 uptake = 46.8% (normal 5-20%),  24 hour I-123 uptake = 58.7% (normal 10-30%)   IMPRESSION: Normal thyroid scan with elevated 4 hour and 24 hour radio iodine uptakes.  Findings consistent with Graves disease.   October 28, 2022 RADIOPHARMACEUTICALS:  15.4 mCi I-131 sodium iodide orally   IMPRESSION: Per oral administration of I-131 sodium iodide for the treatment of Graves disease.    Assessment & Plan:   1.  RAI induced hypothyroidism  2. Graves Disease  This patient is status post RAI thyroid ablation for hyperthyroidism due to Graves' disease. He was initiated on levothyroxine for RAI induced hypothyroidism.  His  previsit labs are consistent with under replacement.  I discussed and increased his levothyroxine to 100 mcg p.o. daily before breakfast.  - We discussed about the correct intake of his thyroid hormone, on empty stomach at fasting, with water, separated by at least 30 minutes from breakfast and other medications,  and separated by more than 4 hours from calcium, iron, multivitamins, acid reflux medications (PPIs). -Patient is made aware of the fact that thyroid hormone replacement is needed for life, dose to be adjusted by periodic monitoring of thyroid function tests.  Of note, this patient took amiodarone in the past for cardiac dysrhythmia.   He has chronic conditions including type 2 diabetes, hyperlipidemia and hypertension on follow-up with his PMD. He is advised to continue on his current medications including atorvastatin 80 mg p.o. daily at bedtime, Jardiance 25 mg p.o. daily at breakfast, metformin 1000 mg p.o. twice daily, metoprolol 25 mg p.o. twice daily.     - he is advised to maintain close follow up with Concha Pyo, MD for primary care needs.   I spent  22  minutes in the care of the patient today including review of labs from Thyroid Function, CMP, and other relevant labs ; imaging/biopsy records (current and previous including abstractions from other facilities); face-to-face time discussing  his lab results and symptoms, medications doses, his options of short and long term treatment based on the latest standards of care / guidelines;   and documenting the encounter.  Allen Walton  participated in the discussions, expressed understanding, and voiced agreement with the above plans.  All questions were answered to his satisfaction. he is encouraged to contact clinic should he have any questions or concerns prior to his return visit.   Follow up plan:  Return in about 4 months (around 07/29/2023) for Fasting Labs  in AM B4 8.   Marquis Lunch, MD [facility]  Group [facility] [address] [place], [place] [address] Phone: [phone]  Fax: [phone]     03/29/2023, 8:34 AM  This note was partially dictated with voice recognition software. Similar sounding words can be transcribed inadequately or may not  be corrected upon review.

## 2023-03-31 ENCOUNTER — Telehealth: Payer: Self-pay | Admitting: "Endocrinology

## 2023-03-31 NOTE — Telephone Encounter (Signed)
Faxed last 2 OV notes to to [facility]

## 2023-07-28 LAB — LIPID PANEL
Chol/HDL Ratio: 3.4 {ratio} (ref 0.0–5.0)
Cholesterol, Total: 119 mg/dL (ref 100–199)
HDL: 35 mg/dL — ABNORMAL LOW (ref >39)
LDL Chol Calc (NIH): 65 mg/dL (ref 0–99)
Triglycerides: 100 mg/dL (ref 0–149)
VLDL Cholesterol Cal: 19 mg/dL (ref 5–40)

## 2023-07-28 LAB — T4, FREE: Free T4: 1.65 ng/dL (ref 0.82–1.77)

## 2023-07-28 LAB — TSH: TSH: 5.31 u[IU]/mL — ABNORMAL HIGH (ref 0.450–4.500)

## 2023-07-29 ENCOUNTER — Ambulatory Visit (INDEPENDENT_AMBULATORY_CARE_PROVIDER_SITE_OTHER): Payer: No Typology Code available for payment source | Admitting: "Endocrinology

## 2023-07-29 ENCOUNTER — Encounter: Payer: Self-pay | Admitting: "Endocrinology

## 2023-07-29 VITALS — BP 104/72 | HR 60 | Ht 74.0 in | Wt 212.8 lb

## 2023-07-29 DIAGNOSIS — E890 Postprocedural hypothyroidism: Secondary | ICD-10-CM

## 2023-07-29 MED ORDER — LEVOTHYROXINE SODIUM 112 MCG PO TABS: 112.0000 ug | ORAL_TABLET | Freq: Every day | ORAL | 1 refills | Status: DC

## 2023-07-29 NOTE — Progress Notes (Signed)
Endocrinology follow-up note                                             07/29/2023, 10:04 AM   Subjective:    Patient ID: Allen Walton, male    DOB: 1971/05/11, PCP Concha Pyo, MD   Past Medical History:  Diagnosis Date   Anginal pain (HCC)    Diabetes mellitus without complication (HCC)    Myocardial infarction [facility])    Past Surgical History:  Procedure Laterality Date   CORONARY ARTERY BYPASS GRAFT N/A 11/29/2020   Procedure: CORONARY ARTERY BYPASS GRAFTING (CABG) TIMES ONE, ON PUMP, USING LEFT INTERNAL MAMMARY ARTERY;  Surgeon: Alleen Borne, MD;  Location: [facility] OR;  Service: Open Heart Surgery;  Laterality: N/A;   CORONARY/GRAFT ACUTE MI REVASCULARIZATION N/A 11/29/2020   Procedure: CORONARY/GRAFT ACUTE MI REVASCULARIZATION;  Surgeon: Runell Gess, MD;  Location: [facility] INVASIVE CV LAB;  Service: Cardiovascular;  Laterality: N/A;   HERNIA REPAIR     TEE WITHOUT CARDIOVERSION N/A 11/29/2020   Procedure: TRANSESOPHAGEAL ECHOCARDIOGRAM (TEE);  Surgeon: Alleen Borne, MD;  Location: [facility] OR;  Service: Open Heart Surgery;  Laterality: N/A;   Social History   Socioeconomic History   Marital status: Married    Spouse name: Not on file   Number of children: Not on file   Years of education: Not on file   Highest education level: Not on file  Occupational History   Not on file  Tobacco Use   Smoking status: Former    Types: Cigarettes   Smokeless tobacco: Never  Vaping Use   Vaping status: Never Used  Substance and Sexual Activity   Alcohol use: Never   Drug use: Never   Sexual activity: Not on file  Other Topics Concern   Not on file  Social History Narrative   Not on file   Social Drivers of Health   Financial Resource Strain: Low Risk  (05/18/2021)   Received from [facility], [facility]   Overall Financial Resource Strain (CARDIA)    Difficulty of Paying Living Expenses: Not hard at all  Food Insecurity: Not on file  Transportation Needs:  No Transportation Needs (05/19/2021)   Received from [facility], [facility]   PRAPARE - Transportation    Lack of Transportation (Medical): No    Lack of Transportation (Non-Medical): No  Physical Activity: Not on file  Stress: No Stress Concern Present (05/18/2021)   Received from [facility], [facility] - Occupational Stress Questionnaire    Feeling of Stress : Not at all  Social Connections: Unknown (11/17/2021)   Received from [facility], [facility]   Social Network    Social Network: Not on file   Family History  Problem Relation Age of Onset   Hypertension Father    Outpatient Encounter Medications as of 07/29/2023  Medication Sig   apixaban (ELIQUIS) 5 MG TABS tablet Take 5 mg by mouth 2 (two) times daily.   aspirin 81 MG EC tablet Take 1 tablet (81 mg total) by mouth daily. Swallow whole.   atorvastatin (LIPITOR) 80 MG tablet Take 1 tablet (80 mg total) by mouth daily.   diclofenac Sodium (VOLTAREN) 1 % GEL Apply topically.   Empagliflozin-metFORMIN HCl ER 25-1000 MG TB24 Take 1 tablet by mouth 2 (two)  times daily.   levothyroxine (SYNTHROID) 112 MCG tablet Take 1 tablet (112 mcg total) by mouth daily before breakfast.   metoprolol tartrate (LOPRESSOR) 25 MG tablet Take 1 tablet (25 mg total) by mouth 2 (two) times daily.   TechLite Lancets MISC Inject 1 applicator into the skin daily as needed.   [DISCONTINUED] levothyroxine (SYNTHROID) 100 MCG tablet Take 1 tablet (100 mcg total) by mouth daily before breakfast.   No facility-administered encounter medications on file as of 07/29/2023.   ALLERGIES: No Known Allergies  VACCINATION STATUS:  There is no immunization history on file for this patient.  HPI Allen Walton is 53 y.o. male who presents today with a medical history as above. he is being seen in f/u after he was seen in  consultation for hyperthyroidism requested by Concha Pyo, MD. See notes from his  prior visit.  -After appropriate workup showed a diagnosis of primary hyperthyroidism from Graves' disease, he was given radioactive iodine thyroid ablation on October 28, 2022.  -He responded to treatment with subsequent RAI induced hypothyroidism for which he was initiated on levothyroxine 100 mcg p.o. daily before breakfast when his last visit.  He returns previsit labs consistent with under replacement.  He continues to feel better, gained a few pounds of the significant weight loss he did have before he was treated.    He has comorbid conditions of hyperlipidemia, hypertension, complicated by at least 1 acute coronary syndrome.  His medications include apixaban, aspirin, atorvastatin, Jardiance, metformin, metoprolol, vitamin D, and vitamin B12.  He denies any family history of thyroid dysfunction or thyroid malignancy.  Review of Systems  Constitutional: + Weight gain, no fatigue, no subjective hyperthermia, no subjective hypothermia    Objective:       07/29/2023    9:34 AM 03/29/2023    8:22 AM 12/15/2022    8:25 AM  Vitals with BMI  Height 6\' 2"  6\' 2"  6\' 2"   Weight 212 lbs 13 oz 205 lbs 3 oz 204 lbs 6 oz  BMI 27.31 26.33 26.23  Systolic 104 92 104  Diastolic 72 70 60  Pulse 60 60 44    BP 104/72   Pulse 60   Ht 6\' 2"  (1.88 m)   Wt 212 lb 12.8 oz (96.5 kg)   BMI 27.32 kg/m   Wt Readings from Last 3 Encounters:  07/29/23 212 lb 12.8 oz (96.5 kg)  03/29/23 205 lb 3.2 oz (93.1 kg)  12/15/22 204 lb 6.4 oz (92.7 kg)    Physical Exam  Constitutional:  Body mass index is 27.32 kg/m.,  not in acute distress, normal state of mind Eyes: PERRLA, EOMI, no exophthalmos ENT:  + Exophthalmos,   +moist mucous membranes, + gross thyromegaly, no gross cervical lymphadenopathy   CMP ( most recent) CMP     Component Value Date/Time   NA 135 12/02/2020 0807   K 3.9 12/02/2020 0807   CL 101 12/02/2020 0807   CO2 26 12/02/2020 0807   GLUCOSE 127 (H) 12/02/2020 0807   BUN 12  12/02/2020 0807   CREATININE 1.11 12/02/2020 0807   CALCIUM 8.7 (L) 12/02/2020 0807   PROT 6.1 (L) 11/29/2020 1415   ALBUMIN 3.3 (L) 11/29/2020 1415   AST 16 11/29/2020 1415   ALT 16 11/29/2020 1415   ALKPHOS 41 11/29/2020 1415   BILITOT 0.6 11/29/2020 1415   GFRNONAA >60 12/02/2020 0807     Diabetic Labs (most recent): Lab Results  Component Value Date   HGBA1C 7.3 (  H) 11/29/2020     Lipid Panel     Component Value Date/Time   CHOL 119 07/27/2023 1125   TRIG 100 07/27/2023 1125   HDL 35 (L) 07/27/2023 1125   CHOLHDL 3.4 07/27/2023 1125   CHOLHDL 6.7 12/02/2020 0434   VLDL 28 12/02/2020 0434   LDLCALC 65 07/27/2023 1125   LABVLDL 19 07/27/2023 1125     Recent Results (from the past 2160 hours)  TSH     Status: Abnormal   Collection Time: 07/27/23 11:25 AM  Result Value Ref Range   TSH 5.310 (H) 0.450 - 4.500 uIU/mL  T4, free     Status: None   Collection Time: 07/27/23 11:25 AM  Result Value Ref Range   Free T4 1.65 0.82 - 1.77 ng/dL  Lipid panel     Status: Abnormal   Collection Time: 07/27/23 11:25 AM  Result Value Ref Range   Cholesterol, Total 119 100 - 199 mg/dL   Triglycerides 865 0 - 149 mg/dL   HDL 35 (L) >78 mg/dL   VLDL Cholesterol Cal 19 5 - 40 mg/dL   LDL Chol Calc (NIH) 65 0 - 99 mg/dL   Chol/HDL Ratio 3.4 0.0 - 5.0 ratio    Comment:                                   T. Chol/HDL Ratio                                             Men  Women                               1/2 Avg.Risk  3.4    3.3                                   Avg.Risk  5.0    4.4                                2X Avg.Risk  9.6    7.1                                3X Avg.Risk 23.4   11.0     Thyroid uptake and scan FINDINGS: Homogeneous tracer distribution in both thyroid lobes.   No focal areas of increased or decreased tracer localization.   4 hour I-123 uptake = 46.8% (normal 5-20%),  24 hour I-123 uptake = 58.7% (normal 10-30%)   IMPRESSION: Normal thyroid scan  with elevated 4 hour and 24 hour radio iodine uptakes.  Findings consistent with Graves disease.   October 28, 2022 RADIOPHARMACEUTICALS:  15.4 mCi I-131 sodium iodide orally   IMPRESSION: Per oral administration of I-131 sodium iodide for the treatment of Graves disease.    Assessment & Plan:   1.  RAI induced hypothyroidism  2. Graves Disease  This patient is status post RAI thyroid ablation for hyperthyroidism due to Graves' disease. He was initiated on levothyroxine for RAI induced hypothyroidism.  His previsit labs are consistent with slight under  replacement.  I discussed and increase his levothyroxine to 112 mcg p.o. daily before breakfast.    - We discussed about the correct intake of his thyroid hormone, on empty stomach at fasting, with water, separated by at least 30 minutes from breakfast and other medications,  and separated by more than 4 hours from calcium, iron, multivitamins, acid reflux medications (PPIs). -Patient is made aware of the fact that thyroid hormone replacement is needed for life, dose to be adjusted by periodic monitoring of thyroid function tests.   Of note, this patient took amiodarone in the past for cardiac dysrhythmia.   He has chronic conditions including type 2 diabetes, hyperlipidemia and hypertension on follow-up with his PMD. He is advised to continue on his current medications including atorvastatin 80 mg p.o. daily at bedtime, Jardiance 25 mg p.o. daily at breakfast, metformin 1000 mg p.o. twice daily, metoprolol 25 mg p.o. twice daily.     - he is advised to maintain close follow up with Concha Pyo, MD for primary care needs.   I spent  22  minutes in the care of the patient today including review of labs from Thyroid Function, CMP, and other relevant labs ; imaging/biopsy records (current and previous including abstractions from other facilities); face-to-face time discussing  his lab results and symptoms, medications doses, his options of  short and long term treatment based on the latest standards of care / guidelines;   and documenting the encounter.  Allen Walton  participated in the discussions, expressed understanding, and voiced agreement with the above plans.  All questions were answered to his satisfaction. he is encouraged to contact clinic should he have any questions or concerns prior to his return visit.    Follow up plan: Return in about 3 months (around 10/27/2023) for F/U with Pre-visit Labs.   Marquis Lunch, MD [facility] [address] [place], [place] [address] Phone: [phone]  Fax: [phone]     07/29/2023, 10:04 AM  This note was partially dictated with voice recognition software. Similar sounding words can be transcribed inadequately or may not  be corrected upon review.

## 2023-10-31 ENCOUNTER — Ambulatory Visit: Payer: Self-pay | Admitting: "Endocrinology

## 2023-11-22 ENCOUNTER — Other Ambulatory Visit (HOSPITAL_COMMUNITY)
Admission: RE | Admit: 2023-11-22 | Discharge: 2023-11-22 | Disposition: A | Discharge status: Home / Self Care | Source: Ambulatory Visit | Attending: "Endocrinology | Admitting: "Endocrinology

## 2023-11-22 DIAGNOSIS — E890 Postprocedural hypothyroidism: Secondary | ICD-10-CM | POA: Insufficient documentation

## 2023-11-22 LAB — TSH: TSH: 0.245 u[IU]/mL — ABNORMAL LOW (ref 0.350–4.500)

## 2023-11-22 LAB — T4, FREE: Free T4: 1.2 ng/dL — ABNORMAL HIGH (ref 0.61–1.12)

## 2023-11-23 ENCOUNTER — Encounter: Payer: Self-pay | Admitting: "Endocrinology

## 2023-11-23 ENCOUNTER — Ambulatory Visit (INDEPENDENT_AMBULATORY_CARE_PROVIDER_SITE_OTHER): Payer: Self-pay | Admitting: "Endocrinology

## 2023-11-23 VITALS — BP 90/62 | HR 56 | Ht 74.0 in | Wt 198.6 lb

## 2023-11-23 DIAGNOSIS — E890 Postprocedural hypothyroidism: Secondary | ICD-10-CM | POA: Diagnosis not present

## 2023-11-23 MED ORDER — LEVOTHYROXINE SODIUM 100 MCG PO TABS: 100.0000 ug | ORAL_TABLET | Freq: Every day | ORAL | 1 refills | Status: DC

## 2023-11-23 NOTE — Progress Notes (Signed)
 Endocrinology follow-up note                                             11/23/2023, 1:52 PM   Subjective:    Patient ID: Allen Walton, male    DOB: [birth year], PCP Deedra Farr, MD   Past Medical History:  Diagnosis Date   Anginal pain (HCC)    Diabetes mellitus without complication (HCC)    Myocardial infarction [facility])    Past Surgical History:  Procedure Laterality Date   CORONARY ARTERY BYPASS GRAFT N/A 11/29/2020   Procedure: CORONARY ARTERY BYPASS GRAFTING (CABG) TIMES ONE, ON PUMP, USING LEFT INTERNAL MAMMARY ARTERY;  Surgeon: Bartley Lightning, MD;  Location: [facility] OR;  Service: Open Heart Surgery;  Laterality: N/A;   CORONARY/GRAFT ACUTE MI REVASCULARIZATION N/A 11/29/2020   Procedure: CORONARY/GRAFT ACUTE MI REVASCULARIZATION;  Surgeon: Avanell Leigh, MD;  Location: [facility] INVASIVE CV LAB;  Service: Cardiovascular;  Laterality: N/A;   HERNIA REPAIR     TEE WITHOUT CARDIOVERSION N/A 11/29/2020   Procedure: TRANSESOPHAGEAL ECHOCARDIOGRAM (TEE);  Surgeon: Bartley Lightning, MD;  Location: [facility] OR;  Service: Open Heart Surgery;  Laterality: N/A;   Social History   Socioeconomic History   Marital status: Married    Spouse name: Not on file   Number of children: Not on file   Years of education: Not on file   Highest education level: Not on file  Occupational History   Not on file  Tobacco Use   Smoking status: Former    Types: Cigarettes   Smokeless tobacco: Never  Vaping Use   Vaping status: Never Used  Substance and Sexual Activity   Alcohol use: Never   Drug use: Never   Sexual activity: Not on file  Other Topics Concern   Not on file  Social History Narrative   Not on file   Social Drivers of Health   Financial Resource Strain: Low Risk  (05/18/2021)   Received from [facility], [facility]   Overall Financial Resource Strain (CARDIA)    Difficulty of Paying Living Expenses: Not hard at all  Food Insecurity: Not on file  Transportation Needs: No  Transportation Needs (05/19/2021)   Received from [facility], [facility]   PRAPARE - Transportation    Lack of Transportation (Medical): No    Lack of Transportation (Non-Medical): No  Physical Activity: Not on file  Stress: No Stress Concern Present (05/18/2021)   Received from [facility], [facility] - Occupational Stress Questionnaire    Feeling of Stress : Not at all  Social Connections: Unknown (11/17/2021)   Received from [facility], [facility]   Social Network    Social Network: Not on file   Family History  Problem Relation Age of Onset   Hypertension Father    Outpatient Encounter Medications as of 11/23/2023  Medication Sig   apixaban (ELIQUIS) 5 MG TABS tablet Take 5 mg by mouth 2 (two) times daily.   aspirin  81 MG EC tablet Take 1 tablet (81 mg total) by mouth daily. Swallow whole.   atorvastatin  (LIPITOR ) 80 MG tablet Take 1 tablet (80 mg total) by mouth daily.   diclofenac Sodium (VOLTAREN) 1 % GEL Apply topically.   Empagliflozin-metFORMIN HCl ER 25-1000 MG TB24 Take 1 tablet by mouth 2 (two)  times daily.   levothyroxine  (SYNTHROID ) 100 MCG tablet Take 1 tablet (100 mcg total) by mouth daily before breakfast.   metoprolol  tartrate (LOPRESSOR ) 25 MG tablet Take 1 tablet (25 mg total) by mouth 2 (two) times daily.   TechLite Lancets MISC Inject 1 applicator into the skin daily as needed.   [DISCONTINUED] levothyroxine  (SYNTHROID ) 112 MCG tablet Take 1 tablet (112 mcg total) by mouth daily before breakfast.   No facility-administered encounter medications on file as of 11/23/2023.   ALLERGIES: No Known Allergies  VACCINATION STATUS:  There is no immunization history on file for this patient.  HPI GARALD Walton is 53 y.o. male who presents today with a medical history as above. he is being seen in f/u after he was seen in  consultation for hyperthyroidism requested by Salman, Faiza, MD. See notes from his  prior visit.  -After appropriate workup showed a diagnosis of primary hyperthyroidism from Graves' disease, he was given radioactive iodine thyroid  ablation on October 28, 2022.  -He responded to treatment with subsequent RAI induced hypothyroidism for which he was initiated on levothyroxine  currently 112 mcg p.o. daily before breakfast.  He returns with previsit thyroid  function test consistent with slight over replacement.   He continues to feel better, gained a few pounds of the significant weight loss he did have before he was treated.    He has comorbid conditions of hyperlipidemia, hypertension, complicated by at least 1 acute coronary syndrome.  His medications include apixaban, aspirin , atorvastatin , Jardiance, metformin, metoprolol , vitamin D, and vitamin B12.  He denies any family history of thyroid  dysfunction or thyroid  malignancy.  Review of Systems  Constitutional: + Weight gain, no fatigue, no subjective hyperthermia, no subjective hypothermia    Objective:       11/23/2023   11:21 AM 07/29/2023    9:34 AM 03/29/2023    8:22 AM  Vitals with BMI  Height 6\' 2"  6\' 2"  6\' 2"   Weight 198 lbs 10 oz 212 lbs 13 oz 205 lbs 3 oz  BMI 25.49 27.31 26.33  Systolic 90 104 92  Diastolic 62 72 70  Pulse 56 60 60    BP 90/62   Pulse (!) 56   Ht 6\' 2"  (1.88 m)   Wt 198 lb 9.6 oz (90.1 kg)   BMI 25.50 kg/m   Wt Readings from Last 3 Encounters:  11/23/23 198 lb 9.6 oz (90.1 kg)  07/29/23 212 lb 12.8 oz (96.5 kg)  03/29/23 205 lb 3.2 oz (93.1 kg)    Physical Exam  Constitutional:  Body mass index is 25.5 kg/m.,  not in acute distress, normal state of mind Eyes: PERRLA, EOMI, no exophthalmos ENT:  + Improving exophthalmos,   +moist mucous membranes, + gross thyromegaly, no gross cervical lymphadenopathy   CMP ( most recent) CMP     Component Value Date/Time   NA 135 12/02/2020 0807   K 3.9 12/02/2020 0807   CL 101 12/02/2020 0807   CO2 26 12/02/2020 0807   GLUCOSE 127 (H)  12/02/2020 0807   BUN 12 12/02/2020 0807   CREATININE 1.11 12/02/2020 0807   CALCIUM  8.7 (L) 12/02/2020 0807   PROT 6.1 (L) 11/29/2020 1415   ALBUMIN  3.3 (L) 11/29/2020 1415   AST 16 11/29/2020 1415   ALT 16 11/29/2020 1415   ALKPHOS 41 11/29/2020 1415   BILITOT 0.6 11/29/2020 1415   GFRNONAA >60 12/02/2020 0807     Diabetic Labs (most recent): Lab Results  Component Value Date  HGBA1C 7.3 (H) 11/29/2020     Lipid Panel     Component Value Date/Time   CHOL 119 07/27/2023 1125   TRIG 100 07/27/2023 1125   HDL 35 (L) 07/27/2023 1125   CHOLHDL 3.4 07/27/2023 1125   CHOLHDL 6.7 12/02/2020 0434   VLDL 28 12/02/2020 0434   LDLCALC 65 07/27/2023 1125   LABVLDL 19 07/27/2023 1125     Recent Results (from the past 2160 hours)  TSH     Status: Abnormal   Collection Time: 11/22/23 12:34 PM  Result Value Ref Range   TSH 0.245 (L) 0.350 - 4.500 uIU/mL    Comment: Performed by a 3rd Generation assay with a functional sensitivity of <=0.01 uIU/mL. Performed at [facility], [address] [place], [place] [address]   T4, free     Status: Abnormal   Collection Time: 11/22/23 12:34 PM  Result Value Ref Range   Free T4 1.20 (H) 0.61 - 1.12 ng/dL    Comment: (NOTE) Biotin ingestion may interfere with free T4 tests. If the results are inconsistent with the TSH level, previous test results, or the clinical presentation, then consider biotin interference. If needed, order repeat testing after stopping biotin. Performed at [facility], [address] [place], [place] [address]     Thyroid  uptake and scan FINDINGS: Homogeneous tracer distribution in both thyroid  lobes.   No focal areas of increased or decreased tracer localization.   4 hour I-123 uptake = 46.8% (normal 5-20%),  24 hour I-123 uptake = 58.7% (normal 10-30%)   IMPRESSION: Normal thyroid  scan with elevated 4 hour and 24 hour radio iodine uptakes.  Findings consistent with Graves disease.   October 28, 2022 RADIOPHARMACEUTICALS:  15.4 mCi I-131 sodium iodide orally   IMPRESSION: Per oral administration of I-131 sodium iodide for the treatment of Graves disease.    Assessment & Plan:   1.  RAI induced hypothyroidism  2. Graves Disease  This patient is status post RAI thyroid  ablation for hyperthyroidism due to Graves' disease. He was initiated on levothyroxine  for RAI induced hypothyroidism.  His previsit thyroid  function tests are consistent with slight over replacement.  I discussed and lowered his dose to levothyroxine  100 mcg p.o. daily before breakfast.      - We discussed about the correct intake of his thyroid  hormone, on empty stomach at fasting, with water, separated by at least 30 minutes from breakfast and other medications,  and separated by more than 4 hours from calcium , iron, multivitamins, acid reflux medications (PPIs). -Patient is made aware of the fact that thyroid  hormone replacement is needed for life, dose to be adjusted by periodic monitoring of thyroid  function tests.   Of note, this patient took amiodarone  in the past for cardiac dysrhythmia.   He has chronic conditions including type 2 diabetes, hyperlipidemia and hypertension on follow-up with his PMD. He is advised to continue on his current medications including atorvastatin  80 mg p.o. daily at bedtime, Jardiance 25 mg p.o. daily at breakfast, metformin 1000 mg p.o. twice daily, metoprolol  25 mg p.o. twice daily.     - he is advised to maintain close follow up with Salman, Faiza, MD for primary care needs.   I spent  21  minutes in the care of the patient today including review of labs from Thyroid  Function, CMP, and other relevant labs ; imaging/biopsy records (current and previous including abstractions from other facilities); face-to-face time discussing  his lab results and symptoms, medications doses,  his options of short and long term treatment based on the latest standards of care / guidelines;   and  documenting the encounter.  Allen Walton  participated in the discussions, expressed understanding, and voiced agreement with the above plans.  All questions were answered to his satisfaction. he is encouraged to contact clinic should he have any questions or concerns prior to his return visit.    Follow up plan: Return in about 6 months (around 05/25/2024) for F/U with Pre-visit Labs.   Kalvin Orf, MD [facility] [address] [place], [place] [address] Phone: [phone]  Fax: [phone]     11/23/2023, 1:52 PM  This note was partially dictated with voice recognition software. Similar sounding words can be transcribed inadequately or may not  be corrected upon review.

## 2024-05-11 ENCOUNTER — Other Ambulatory Visit (HOSPITAL_COMMUNITY)
Admission: RE | Admit: 2024-05-11 | Discharge: 2024-05-11 | Disposition: A | Discharge status: Home / Self Care | Source: Ambulatory Visit | Attending: "Endocrinology | Admitting: "Endocrinology

## 2024-05-11 DIAGNOSIS — E890 Postprocedural hypothyroidism: Secondary | ICD-10-CM | POA: Diagnosis present

## 2024-05-11 LAB — T4, FREE: Free T4: 0.93 ng/dL (ref 0.61–1.12)

## 2024-05-11 LAB — TSH: TSH: 5.02 u[IU]/mL — ABNORMAL HIGH (ref 0.350–4.500)

## 2024-05-15 ENCOUNTER — Ambulatory Visit (INDEPENDENT_AMBULATORY_CARE_PROVIDER_SITE_OTHER): Admitting: "Endocrinology

## 2024-05-15 ENCOUNTER — Encounter: Payer: Self-pay | Admitting: "Endocrinology

## 2024-05-15 VITALS — BP 114/72 | HR 60 | Ht 74.0 in | Wt 215.2 lb

## 2024-05-15 DIAGNOSIS — E890 Postprocedural hypothyroidism: Secondary | ICD-10-CM | POA: Diagnosis not present

## 2024-05-15 MED ORDER — LEVOTHYROXINE SODIUM 112 MCG PO TABS: 112.0000 ug | ORAL_TABLET | Freq: Every day | ORAL | 1 refills | Status: AC

## 2024-05-15 NOTE — Progress Notes (Signed)
 Endocrinology follow-up note                                             05/15/2024, 12:42 PM   Subjective:    Patient ID: Allen Walton, male    DOB: 1970/12/02, PCP Ellouise Lark, MD   Past Medical History:  Diagnosis Date   Anginal pain    Diabetes mellitus without complication (HCC)    Myocardial infarction [facility])    Past Surgical History:  Procedure Laterality Date   CORONARY ARTERY BYPASS GRAFT N/A 11/29/2020   Procedure: CORONARY ARTERY BYPASS GRAFTING (CABG) TIMES ONE, ON PUMP, USING LEFT INTERNAL MAMMARY ARTERY;  Surgeon: Lucas Dorise POUR, MD;  Location: [facility] OR;  Service: Open Heart Surgery;  Laterality: N/A;   CORONARY/GRAFT ACUTE MI REVASCULARIZATION N/A 11/29/2020   Procedure: CORONARY/GRAFT ACUTE MI REVASCULARIZATION;  Surgeon: Court Dorn PARAS, MD;  Location: [facility] INVASIVE CV LAB;  Service: Cardiovascular;  Laterality: N/A;   HERNIA REPAIR     TEE WITHOUT CARDIOVERSION N/A 11/29/2020   Procedure: TRANSESOPHAGEAL ECHOCARDIOGRAM (TEE);  Surgeon: Lucas Dorise POUR, MD;  Location: [facility] OR;  Service: Open Heart Surgery;  Laterality: N/A;   Social History   Socioeconomic History   Marital status: Married    Spouse name: Not on file   Number of children: Not on file   Years of education: Not on file   Highest education level: Not on file  Occupational History   Not on file  Tobacco Use   Smoking status: Former    Types: Cigarettes   Smokeless tobacco: Never  Vaping Use   Vaping status: Never Used  Substance and Sexual Activity   Alcohol use: Never   Drug use: Never   Sexual activity: Not on file  Other Topics Concern   Not on file  Social History Narrative   Not on file   Social Drivers of Health   Financial Resource Strain: Low Risk  (05/18/2021)   Received from [facility]   Overall Financial Resource Strain (CARDIA)    Difficulty of Paying Living Expenses: Not hard at all  Food Insecurity: Not on file  Transportation Needs: No Transportation  Needs (05/19/2021)   Received from [facility] - Transportation    Lack of Transportation (Medical): No    Lack of Transportation (Non-Medical): No  Physical Activity: Not on file  Stress: No Stress Concern Present (05/18/2021)   Received from [facility] - Occupational Stress Questionnaire    Feeling of Stress : Not at all  Social Connections: Unknown (11/17/2021)   Received from [facility]   Social Network    Social Network: Not on file   Family History  Problem Relation Age of Onset   Hypertension Father    Outpatient Encounter Medications as of 05/15/2024  Medication Sig   sertraline (ZOLOFT) 50 MG tablet Take 50 mg by mouth daily.   apixaban (ELIQUIS) 5 MG TABS tablet Take 5 mg by mouth 2 (two) times daily.   aspirin  81 MG EC tablet Take 1 tablet (81 mg total) by mouth daily. Swallow whole.   atorvastatin  (LIPITOR ) 80 MG tablet Take 1 tablet (80 mg total) by mouth daily.   diclofenac Sodium (VOLTAREN) 1 % GEL Apply topically.   Empagliflozin-metFORMIN HCl ER 25-1000 MG TB24 Take 1 tablet  by mouth 2 (two) times daily.   levothyroxine  (SYNTHROID ) 112 MCG tablet Take 1 tablet (112 mcg total) by mouth daily before breakfast.   metoprolol  tartrate (LOPRESSOR ) 25 MG tablet Take 1 tablet (25 mg total) by mouth 2 (two) times daily.   TechLite Lancets MISC Inject 1 applicator into the skin daily as needed.   [DISCONTINUED] levothyroxine  (SYNTHROID ) 100 MCG tablet Take 1 tablet (100 mcg total) by mouth daily before breakfast.   No facility-administered encounter medications on file as of 05/15/2024.   ALLERGIES: No Known Allergies  VACCINATION STATUS:  There is no immunization history on file for this patient.  HPI Allen Walton is 53 y.o. male who presents today with a medical history as above. he is being seen in f/u after he was seen in  consultation for hyperthyroidism requested by Salman, Faiza, MD. See notes from his  prior visit.  -After appropriate workup showed a diagnosis of primary hyperthyroidism from Graves' disease, he was given radioactive iodine thyroid  ablation on October 28, 2022.  -He responded to treatment with subsequent RAI induced hypothyroidism for which he was initiated on levothyroxine .  He is currently on levothyroxine  100 mcg p.o. daily before breakfast.  His previous labs are consistent with under replacement.   He has no new complaints today.  He has regained most of the weight he lost in the prior to his last visit.  He has comorbid conditions of hyperlipidemia, hypertension, complicated by at least 1 acute coronary syndrome.  His medications include apixaban, aspirin , atorvastatin , Jardiance, metformin, metoprolol , vitamin D, and vitamin B12.  He denies any family history of thyroid  dysfunction or thyroid  malignancy.  Review of Systems  Constitutional: + Weight gain, no fatigue, no subjective hyperthermia, no subjective hypothermia    Objective:       05/15/2024    8:42 AM 11/23/2023   11:21 AM 07/29/2023    9:34 AM  Vitals with BMI  Height 6' 2 6' 2 6' 2  Weight 215 lbs 3 oz 198 lbs 10 oz 212 lbs 13 oz  BMI 27.62 25.49 27.31  Systolic 114 90 104  Diastolic 72 62 72  Pulse 60 56 60    BP 114/72   Pulse 60   Ht 6' 2 (1.88 m)   Wt 215 lb 3.2 oz (97.6 kg)   BMI 27.63 kg/m   Wt Readings from Last 3 Encounters:  05/15/24 215 lb 3.2 oz (97.6 kg)  11/23/23 198 lb 9.6 oz (90.1 kg)  07/29/23 212 lb 12.8 oz (96.5 kg)    Physical Exam  Constitutional:  Body mass index is 27.63 kg/m.,  not in acute distress, normal state of mind Eyes: PERRLA, EOMI, no exophthalmos ENT:  + Improving exophthalmos,   +moist mucous membranes, + gross thyromegaly, no gross cervical lymphadenopathy   CMP ( most recent) CMP     Component Value Date/Time   NA 135 12/02/2020 0807   K 3.9 12/02/2020 0807   CL 101 12/02/2020 0807   CO2 26 12/02/2020 0807   GLUCOSE 127 (H) 12/02/2020 0807    BUN 12 12/02/2020 0807   CREATININE 1.11 12/02/2020 0807   CALCIUM  8.7 (L) 12/02/2020 0807   PROT 6.1 (L) 11/29/2020 1415   ALBUMIN  3.3 (L) 11/29/2020 1415   AST 16 11/29/2020 1415   ALT 16 11/29/2020 1415   ALKPHOS 41 11/29/2020 1415   BILITOT 0.6 11/29/2020 1415   GFRNONAA >60 12/02/2020 0807     Diabetic Labs (most recent): Lab Results  Component Value Date   HGBA1C 7.3 (H) 11/29/2020     Lipid Panel     Component Value Date/Time   CHOL 119 07/27/2023 1125   TRIG 100 07/27/2023 1125   HDL 35 (L) 07/27/2023 1125   CHOLHDL 3.4 07/27/2023 1125   CHOLHDL 6.7 12/02/2020 0434   VLDL 28 12/02/2020 0434   LDLCALC 65 07/27/2023 1125   LABVLDL 19 07/27/2023 1125     Recent Results (from the past 2160 hours)  TSH     Status: Abnormal   Collection Time: 05/11/24  8:45 AM  Result Value Ref Range   TSH 5.020 (H) 0.350 - 4.500 uIU/mL    Comment: Performed at [facility], [address], [place], [place] [address]  T4, free     Status: None   Collection Time: 05/11/24  8:46 AM  Result Value Ref Range   Free T4 0.93 0.61 - 1.12 ng/dL    Comment: (NOTE) Biotin ingestion may interfere with free T4 tests. If the results are inconsistent with the TSH level, previous test results, or the clinical presentation, then consider biotin interference. If needed, order repeat testing after stopping biotin. Performed at [facility], [address] [place], [place] [address]     Thyroid  uptake and scan FINDINGS: Homogeneous tracer distribution in both thyroid  lobes.   No focal areas of increased or decreased tracer localization.   4 hour I-123 uptake = 46.8% (normal 5-20%),  24 hour I-123 uptake = 58.7% (normal 10-30%)   IMPRESSION: Normal thyroid  scan with elevated 4 hour and 24 hour radio iodine uptakes.  Findings consistent with Graves disease.   October 28, 2022 RADIOPHARMACEUTICALS:  15.4 mCi I-131 sodium iodide orally   IMPRESSION: Per oral administration of  I-131 sodium iodide for the treatment of Graves disease.    Assessment & Plan:   1.  RAI induced hypothyroidism  2. Graves Disease  This patient is status post RAI thyroid  ablation for hyperthyroidism due to Graves' disease. He was initiated on levothyroxine  for RAI induced hypothyroidism.  His previsit thyroid  function tests are consistent with under replacement.  I discussed and increased his levothyroxine  back to 112 mcg p.o. daily before breakfast.     - We discussed about the correct intake of his thyroid  hormone, on empty stomach at fasting, with water, separated by at least 30 minutes from breakfast and other medications,  and separated by more than 4 hours from calcium , iron, multivitamins, acid reflux medications (PPIs). -Patient is made aware of the fact that thyroid  hormone replacement is needed for life, dose to be adjusted by periodic monitoring of thyroid  function tests.   Of note, this patient took amiodarone  in the past for cardiac dysrhythmia.   He has chronic conditions including type 2 diabetes, hyperlipidemia and hypertension on follow-up with his PMD. He is advised to continue on his current medications including atorvastatin  80 mg p.o. daily at bedtime, Jardiance 25 mg p.o. daily at breakfast, metformin 1000 mg p.o. twice daily, metoprolol  25 mg p.o. twice daily.     - he is advised to maintain close follow up with Salman, Faiza, MD for primary care needs.   I spent  22  minutes in the care of the patient today including review of labs from Thyroid  Function, CMP, and other relevant labs ; imaging/biopsy records (current and previous including abstractions from other facilities); face-to-face time discussing  his lab results and symptoms, medications doses, his options of short and long term treatment based on  the latest standards of care / guidelines;   and documenting the encounter.  Allen Walton  participated in the discussions, expressed understanding, and voiced  agreement with the above plans.  All questions were answered to his satisfaction. he is encouraged to contact clinic should he have any questions or concerns prior to his return visit.    Follow up plan: Return in about 6 months (around 11/12/2024) for F/U with Pre-visit Labs.   Ranny Earl, MD [facility] [address] [place], [place] [address] Phone: [phone]  Fax: [phone]     05/15/2024, 12:42 PM  This note was partially dictated with voice recognition software. Similar sounding words can be transcribed inadequately or may not  be corrected upon review.

## 2024-06-12 ENCOUNTER — Ambulatory Visit (INDEPENDENT_AMBULATORY_CARE_PROVIDER_SITE_OTHER)

## 2024-06-12 ENCOUNTER — Encounter (INDEPENDENT_AMBULATORY_CARE_PROVIDER_SITE_OTHER): Payer: Self-pay

## 2024-06-12 VITALS — BP 111/70 | HR 67 | Temp 97.9°F | Ht 73.0 in | Wt 210.0 lb

## 2024-06-12 DIAGNOSIS — I252 Old myocardial infarction: Secondary | ICD-10-CM

## 2024-06-12 DIAGNOSIS — I10 Essential (primary) hypertension: Secondary | ICD-10-CM

## 2024-06-12 DIAGNOSIS — G4733 Obstructive sleep apnea (adult) (pediatric): Secondary | ICD-10-CM

## 2024-06-12 DIAGNOSIS — Z789 Other specified health status: Secondary | ICD-10-CM

## 2024-06-12 NOTE — Patient Instructions (Signed)
 ### Preop Inspire Instructions     **Preoperative Discharge Instructions: Inspire Hypoglossal Nerve Stimulator Placement**      - **Procedure Overview:**        The Inspire hypoglossal nerve stimulator is an FDA-approved surgical therapy for select patients with moderate to severe obstructive sleep apnea (OSA) who are unable to tolerate or adhere to positive airway pressure therapy. The device stimulates the hypoglossal nerve to improve upper airway patency during sleep.[1][2][3]      - **Preoperative Evaluation:**        - All candidates must undergo comprehensive evaluation, including polysomnography to confirm OSA severity and exclusion of central or mixed sleep apnea.[2][3][4]      - Drug-induced sleep endoscopy (DISE) is required prior to implantation. DISE is performed under sedation to directly visualize the pattern of upper airway collapse.        - DISE is essential to exclude patients with complete concentric collapse of the soft palate, which predicts poor response to hypoglossal nerve stimulation.[2][5][3][4][6][7]      - DISE also helps identify the primary site of obstruction (e.g., tongue base, palate, lateral walls) and guides surgical planning.[5][6]      - The VOTE classification (Velum, Oropharynx, Tongue base, Epiglottis) is used to document findings.[5][7]      - **Preoperative Instructions:**        - Continue routine medications unless otherwise directed.      - Fast as instructed prior to DISE (typically no food or drink for 6-8 hours before the procedure).      - Arrange for transportation home after DISE, as sedation will be used.      - Notify the surgical team of any changes in health status, new medications, or allergies.      - **Risks and Expectations:**        - DISE is generally well tolerated; risks include transient hypoxemia, aspiration, or adverse reaction to sedation.      - The results of DISE will determine eligibility for Inspire  implantation. Patients with complete concentric palatal collapse or unfavorable anatomy will not be offered the device.[2][5][3][4][6][7]      - If eligible, the surgical team will discuss the next steps, including scheduling for device implantation and perioperative planning.      - **Follow-Up:**        - After DISE, expect a follow-up visit to review findings and finalize candidacy.      - If proceeding to surgery, further instructions regarding perioperative care and device activation will be provided.      - **Contact Information:**        - For questions or concerns prior to DISE or surgery, contact the otolaryngology clinic.      **Summary:**      Drug-induced sleep endoscopy is a mandatory step in the preoperative evaluation for Inspire hypoglossal nerve stimulator placement. It ensures appropriate patient selection and optimizes surgical outcomes by identifying airway collapse patterns and excluding patients unlikely to benefit from therapy.[2][5][3][4][6][7]      ### References  1. Hypoglossal Nerve Stimulator: A Novel Treatment Approach for OSA - Overview of Treatment, Including Diagnostic and Patient Criteria and Procedural Terminology Codes. Norval JULIANNA Gaines N. Chest. 2021;160(4):1406-1412. doi:10.1016/[email or web address]. 2. Upper-Airway Stimulation for Obstructive Sleep Apnea. Strollo PJ, Soose RJ, Maurer JT, et al. The New England Journal of Medicine. 2014;370(2):139-49. doi:10.1056/[email or web address]. 3. Diagnosis and Management of Obstructive Sleep Apnea: A Review. Lora HIKES, [place] NM. JAMA. 2020;323(14):1389-1400. doi:10.1001/jama.7979.6485. 4. Evaluation of Hypoglossal Nerve Stimulation Treatment in Obstructive  Sleep Apnea. [address] DT, Carden KA, Wang L, Lindsell CJ, [place]. JAMA Otolaryngology-- Head & Neck Surgery. 2019;145(11):1044-1052. doi:10.1001/jamaoto.7980.7276. 5. Drug-Induced Sleep Endoscopy and Hypoglossal Nerve Stimulation Outcomes: A Multicenter Cohort Study.  Vannie SHAUNNA Drape DT, D'Agostino MA, et al. [employer]. 2021;131(7):1676-1682. doi:10.1002/lary.70603. 6. Success of Hypoglossal Nerve Stimulation Using Mandibular Advancement During Sleep Endoscopy. [place], [place] RC. The Laryngoscope. 2020;130(12):2917-2921. doi:10.1002/lary.71410. 7. Evaluation of Upper Airway Stimulation for Adolescents With Down Syndrome and Obstructive Sleep Apnea. Babara AMBLE, Stenerson M, Ishman SL, et al. JAMA Otolaryngology-- Head & Neck Surgery. 2022;148(6):522-528. doi:10.1001/jamaoto.2022.0455.

## 2024-06-12 NOTE — Progress Notes (Unsigned)
 SUBJECTIVE:  Chief Complaint: CPAP intolerance Referring sleep doc: Francis Balo  HPI: Pt is a 53 y.o. year old male with a h/o OSA dx approx 2 yrs ago. Since diagnosis, pt been using their CPAP/BiPAP device, but has had difficulty tolaerating the CPAP. He is not uusing it consistently. AHI on most recent diagnostic sleep study is unknown. Pt has not been able to tolerate CPAP due to irritation. Pt has tried CPAP for their OSA. Pt has not had a DISE exam in the past. Pt had recent weight changes.  Pt does have co-existing cardiovascular disease and history of MI. Pt has not been  diagnosed with insomnia in the past.  I reviewed Francis Guess note.   Past Medical/Surgical History He  His  has a past surgical history that includes Hernia repair; Coronary/Graft Acute MI Revascularization (N/A, 11/29/2020); Coronary artery bypass graft (N/A, 11/29/2020); and TEE without cardioversion (N/A, 11/29/2020).  Past Family/Social History His family history includes Hypertension in his father. He  reports that he has quit smoking. His smoking use included cigarettes. He has never used smokeless tobacco. He reports that he does not drink alcohol and does not use drugs.  Medications/Allergies/Immunizations Allergies: Patient has no known allergies., Immunizations:  There is no immunization history on file for this patient.  Review of Systems  ROS: Constitutional: Negative for fever, weight loss and weight gain. Cardiovascular: Negative for chest pain and dyspnea on exertion. Respiratory: Is not experiencing shortness of breath at rest. Gastrointestinal: Negative for nausea and vomiting. Neurological: Negative for headaches. Psychiatric: The patient is not nervous/anxious  OBJECTIVE:  Physical Exam Blood pressure 111/70, pulse 67, temperature 97.9 F (36.6 C), height 6' 1 (1.854 m), weight 210 lb (95.3 kg), SpO2 95%. General:  Well-developed, well-nourished, no apparent distress Peripheral  Vascular:  Warm extremities with equal pulses Eyes: No nystagmus with equal extraocular motion bilaterally Neuro/Psych/Balance: Patient oriented to person, place, and time;  Appropriate mood and affect;  Gait is intact with no imbalance; Cranial nerves I-XII are intact Head and Face Inspection:  Normocephalic and atraumatic without mass or lesion Palpation:  Facial skeleton intact without bony stepoffs Salivary Glands:  No masses or tenderness Facial Strength:  Facial motility symmetric and full bilaterally ENT Pinna:  External ear intact and fully developed bilaterally External canal:  Canal is patent with intact skin bilaterally Tympanic Membrane:  Clear and mobile bilaterally Hearing: Midline Weber, pos Rinne bilaterally, normal clinical speech reception threshold (whispered voice, finger rub) External nose:  No scar or anatomic deformity Internal Nose:  Septum intact and midline.  No edema, polyp, or rhinorrhea. Lips, Teeth, and gums:  Mucosa and teeth intact and viable TMJ:  No pain to palpation with full mobility Oral cavity/oropharynx:  No erythema or exudate, 1+ tonsils Tongue/palate position: Friedman 3 Nasopharynx:  No mass or lesion with intact mucosa Hypopharynx:  Intact mucosa without pooling of secretions Larynx:  Full true vocal cord mobility without lesion or mass Neck Neck and Trachea:  Midline trachea without mass or lesion Thyroid :  No mass or nodularity Lymphatics:  No lymphadenopathy  Preoperative diagnosis: OSA CPT 31231 mod 25 Postoperative diagnosis:   Same  Procedure: Flexible fiberoptic laryngoscopy  Surgeon:Ivey Nembhard   Anesthesia: Topical lidocaine  and Afrin Complications: None Condition is stable throughout exam  Indications and consent:  The patient presents to the clinic with Indirect laryngoscopy view was incomplete. Thus it was recommended that they undergo a flexible fiberoptic laryngoscopy. All of the risks, benefits, and potential  complications were  reviewed with the patient preoperatively and verbal informed consent was obtained.  Procedure: The patient was seated upright in the clinic. Topical lidocaine  and Afrin were applied to the nasal cavity. After adequate anesthesia had occurred, I then proceeded to pass the flexible telescope into the nasal cavity. The nasal cavity was patent without rhinorrhea or polyp. The nasopharynx was also patent without mass or lesion. The base of tongue was visualized and was normal. There were no signs of pooling of secretions in the piriform sinuses. The true vocal folds were mobile bilaterally. There were no signs of glottic or supraglottic mucosal lesion or mass. There was  interarytenoid pachydermia and post cricoid edema. The telescope was then slowly withdrawn and the patient tolerated the procedure throughout.  ASSESSMENT/PLAN: OSA on CPAP - Plan: Ambulatory Referral For Surgery Scheduling  Intolerance of continuous positive airway pressure (CPAP) ventilation  History of MI (myocardial infarction)  Hypertension, unspecified type  OSA, with unknown severity. Unable to locate previous PSG or AHI in referral notes. He is with failure to tolerate PAP therapy and/or more conservative measures. Presence of smaller/absent tonsils and larger tongue position (Friedman tongue position or modified Mallampati) suggests that hypopharyngeal/retrolingual collapse is contributing to the patient's OSA. Janeth, M et al. Staging of obstructive sleep apnea/hypopnea syndrome: a guide to appropriate treatment. Laryngoscope, 2004 Mar, 114(3):454-9. PMID: [id number]) Options including positional therapy, weight loss, oral appliances, PAP and surgical correction discussed. Pt could be a candidate for Hypoglossal nerve stimulation (Inspire therapy) pending DISE

## 2024-06-20 ENCOUNTER — Encounter (HOSPITAL_BASED_OUTPATIENT_CLINIC_OR_DEPARTMENT_OTHER): Admission: RE | Payer: Self-pay

## 2024-06-20 ENCOUNTER — Ambulatory Visit (HOSPITAL_BASED_OUTPATIENT_CLINIC_OR_DEPARTMENT_OTHER): Admission: RE | Admit: 2024-06-20 | Source: Home / Self Care

## 2024-06-20 SURGERY — DRUG INDUCED SLEEP ENDOSCOPY
Anesthesia: General

## 2024-07-02 ENCOUNTER — Encounter (INDEPENDENT_AMBULATORY_CARE_PROVIDER_SITE_OTHER): Payer: Self-pay

## 2024-07-02 ENCOUNTER — Telehealth (INDEPENDENT_AMBULATORY_CARE_PROVIDER_SITE_OTHER): Payer: Self-pay

## 2024-07-02 NOTE — Telephone Encounter (Signed)
 [facility] so I can call and confirm surgical clearance. Patient did not answer left a voicemail asking patient to give us  a call back that I had a couple questions for him.

## 2024-07-04 NOTE — Telephone Encounter (Signed)
 Called patient to let them know we finally got the Surgical Clearance and that our surgical scheduling team is looking over Dr. Vergie surgery schedule and they will be reaching out to him in a few days to get his procedure scheduled. Patient understood.

## 2024-07-10 ENCOUNTER — Telehealth (INDEPENDENT_AMBULATORY_CARE_PROVIDER_SITE_OTHER): Payer: Self-pay

## 2024-07-10 ENCOUNTER — Encounter (HOSPITAL_COMMUNITY): Payer: Self-pay

## 2024-07-10 ENCOUNTER — Other Ambulatory Visit: Payer: Self-pay

## 2024-07-10 NOTE — Progress Notes (Signed)
 SDW CALL  Patient was given pre-op instructions over the phone. The opportunity was given for the patient to ask questions. No further questions asked. Patient verbalized understanding of instructions given.   PCP - Salman, Faiza, MD  Cardiologist - [facility]--can't remember name  PPM/ICD - n/a Device Orders -  Rep Notified -   Chest x-ray -  EKG - DOS Stress Test -  ECHO - 11/29/20 Cardiac Cath - 11/29/20  Sleep Study - 2 years ago CPAP - wears nightly  Fasting Blood Sugar - range from 70-130  Checks Blood Sugar twice a week Hold Synjardy 72hrs prior to surgery  Last dose of GLP1 agonist-  n/a GLP1 instructions:   Blood Thinner Instructions: No instructions regarding Eliquis received. Attempted to call ENT office, office closed. Pt instructed and verbalizes he will call office 07/11/24 to obtain instructions.  Aspirin  Instructions: told to stop and resume 48 hours after procedure  ERAS Protcol - clears until 1000 PRE-SURGERY Ensure or G2-   COVID TEST- n/a   Anesthesia review: hx MI, stroke  Patient denies shortness of breath, fever, cough and chest pain over the phone call   All instructions explained to the patient, with a verbal understanding of the material. Patient agrees to go over the instructions while at home for a better understanding.

## 2024-07-10 NOTE — Telephone Encounter (Signed)
 Called patient to let them know to stop taking Asprin 81 MG tomorrow and to start it back up 48 hours after patients surgery.

## 2024-07-11 NOTE — Anesthesia Preprocedure Evaluation (Addendum)
 "                                  Anesthesia Evaluation  Patient identified by MRN, date of birth, ID band Patient awake    Reviewed: Allergy & Precautions, NPO status , Patient's Chart, lab work & pertinent test results  History of Anesthesia Complications Negative for: history of anesthetic complications  Airway Mallampati: II  TM Distance: >3 FB Neck ROM: Full    Dental no notable dental hx. (+) Teeth Intact   Pulmonary sleep apnea , neg COPD, Patient abstained from smoking.Not current smoker, former smoker   Pulmonary exam normal breath sounds clear to auscultation       Cardiovascular Exercise Tolerance: Good METShypertension, + Past MI  (-) CAD (-) dysrhythmias  Rhythm:Regular Rate:Normal - Systolic murmurs    Neuro/Psych  PSYCHIATRIC DISORDERS Anxiety Depression    CVA    GI/Hepatic ,neg GERD  ,,(+)     (-) substance abuse    Endo/Other  diabetesHypothyroidism    Renal/GU negative Renal ROS     Musculoskeletal   Abdominal   Peds  Hematology   Anesthesia Other Findings Per PAT note: 53 year old male follows with cardiology at the [facility] for history of CVA 05/2021, paroxysmal atrial fibrillation on Eliquis, HLD, frequent PVCs, CAD s/p inferior STEMI 11/2023 with emergent CABG x 1 with LIMA to LAD.  Echo 03/2021 showed LVEF 55 to 60%, normal diastolic function, normal RV, no significant valvular disease.  Last seen in follow-up 05/29/2024 and noted to be doing well without symptoms of angina or heart failure.  He was continued on metoprolol , atorvastatin , Eliquis, ASA 81 mg.  1 year follow-up recommended.   Other pertinent history includes OSA intolerant to CPAP, former smoker (quit 2024), hypothyroidism, non-insulin -dependent DM2 (A1c 7.0 on 05/03/2024).   Patient will need day of surgery labs and evaluation.   EKG 05/29/2024 (Care Everywhere): NSR, frequent PVCs.   TTE 03/2021 (Care Everywhere): There is no evidence of significant valvular  disease.  The left ventricular size, thickness and function are normal Ejection Fraction = 55-60% (Visual Estimation). Tissue Doppler sampling consistent with Normal diastolic function.. The right ventricle is normal in size and function.  The aortic root is normal size. There is no pericardial effusion. Septal motion is consistent with post-operative state.    Reproductive/Obstetrics                              Anesthesia Physical Anesthesia Plan  ASA: 3  Anesthesia Plan: MAC   Post-op Pain Management: Minimal or no pain anticipated   Induction: Intravenous  PONV Risk Score and Plan: 1 and Propofol  infusion, TIVA, Ondansetron  and Treatment may vary due to age or medical condition  Airway Management Planned: Nasal Cannula  Additional Equipment: None  Intra-op Plan:   Post-operative Plan:   Informed Consent: I have reviewed the patients History and Physical, chart, labs and discussed the procedure including the risks, benefits and alternatives for the proposed anesthesia with the patient or authorized representative who has indicated his/her understanding and acceptance.     Dental advisory given  Plan Discussed with: CRNA and Surgeon  Anesthesia Plan Comments: (Discussed risks of anesthesia with patient, including possibility of difficulty with spontaneous ventilation under anesthesia necessitating airway intervention, PONV, and rare risks such as cardiac or respiratory or neurological events, and allergic reactions. Discussed the role  of CRNA in patient's perioperative care. Patient understands.)         Anesthesia Quick Evaluation  "

## 2024-07-11 NOTE — Progress Notes (Signed)
 Anesthesia Chart Review: Same day workup  53 year old male follows with cardiology at the [facility] for history of CVA 05/2021, paroxysmal atrial fibrillation on Eliquis, HLD, frequent PVCs, CAD s/p inferior STEMI 11/2023 with emergent CABG x 1 with LIMA to LAD.  Echo 03/2021 showed LVEF 55 to 60%, normal diastolic function, normal RV, no significant valvular disease.  Last seen in follow-up 05/29/2024 and noted to be doing well without symptoms of angina or heart failure.  He was continued on metoprolol , atorvastatin , Eliquis, ASA 81 mg.  1 year follow-up recommended.  Other pertinent history includes OSA intolerant to CPAP, former smoker (quit 2024), hypothyroidism, non-insulin -dependent DM2 (A1c 7.0 on 05/03/2024).  Patient will need day of surgery labs and evaluation.  EKG 05/29/2024 (Care Everywhere): NSR, frequent PVCs.  TTE 03/2021 (Care Everywhere): There is no evidence of significant valvular disease.  The left ventricular size, thickness and function are normal Ejection Fraction = 55-60% (Visual Estimation). Tissue Doppler sampling consistent with Normal diastolic function.. The right ventricle is normal in size and function.  The aortic root is normal size. There is no pericardial effusion. Septal motion is consistent with post-operative state.      Allen Walton [facility] Short Stay Center/Anesthesiology Phone [phone] 07/11/2024 12:03 PM

## 2024-07-18 ENCOUNTER — Ambulatory Visit (HOSPITAL_COMMUNITY): Admission: RE | Admit: 2024-07-18 | Discharge: 2024-07-18 | Disposition: A | Discharge status: Home / Self Care

## 2024-07-18 ENCOUNTER — Encounter (HOSPITAL_COMMUNITY): Admitting: Physician Assistant

## 2024-07-18 ENCOUNTER — Encounter (HOSPITAL_COMMUNITY): Admission: RE | Disposition: A | Payer: Self-pay | Source: Home / Self Care

## 2024-07-18 ENCOUNTER — Ambulatory Visit (HOSPITAL_BASED_OUTPATIENT_CLINIC_OR_DEPARTMENT_OTHER): Admitting: Physician Assistant

## 2024-07-18 ENCOUNTER — Encounter (HOSPITAL_COMMUNITY): Payer: Self-pay

## 2024-07-18 ENCOUNTER — Other Ambulatory Visit: Payer: Self-pay

## 2024-07-18 DIAGNOSIS — I10 Essential (primary) hypertension: Secondary | ICD-10-CM

## 2024-07-18 DIAGNOSIS — F32A Depression, unspecified: Secondary | ICD-10-CM | POA: Diagnosis not present

## 2024-07-18 DIAGNOSIS — Z789 Other specified health status: Secondary | ICD-10-CM | POA: Insufficient documentation

## 2024-07-18 DIAGNOSIS — Z8673 Personal history of transient ischemic attack (TIA), and cerebral infarction without residual deficits: Secondary | ICD-10-CM | POA: Insufficient documentation

## 2024-07-18 DIAGNOSIS — I493 Ventricular premature depolarization: Secondary | ICD-10-CM | POA: Diagnosis not present

## 2024-07-18 DIAGNOSIS — F418 Other specified anxiety disorders: Secondary | ICD-10-CM | POA: Diagnosis not present

## 2024-07-18 DIAGNOSIS — I2510 Atherosclerotic heart disease of native coronary artery without angina pectoris: Secondary | ICD-10-CM | POA: Diagnosis not present

## 2024-07-18 DIAGNOSIS — Z87891 Personal history of nicotine dependence: Secondary | ICD-10-CM

## 2024-07-18 DIAGNOSIS — I252 Old myocardial infarction: Secondary | ICD-10-CM | POA: Diagnosis not present

## 2024-07-18 DIAGNOSIS — F419 Anxiety disorder, unspecified: Secondary | ICD-10-CM | POA: Insufficient documentation

## 2024-07-18 DIAGNOSIS — G4733 Obstructive sleep apnea (adult) (pediatric): Secondary | ICD-10-CM | POA: Diagnosis not present

## 2024-07-18 HISTORY — PX: DRUG INDUCED ENDOSCOPY: SHX6808

## 2024-07-18 LAB — CBC
HCT: 48.7 % (ref 39.0–52.0)
Hemoglobin: 15.5 g/dL (ref 13.0–17.0)
MCH: 24 pg — ABNORMAL LOW (ref 26.0–34.0)
MCHC: 31.8 g/dL (ref 30.0–36.0)
MCV: 75.3 fL — ABNORMAL LOW (ref 80.0–100.0)
Platelets: 305 K/uL (ref 150–400)
RBC: 6.47 MIL/uL — ABNORMAL HIGH (ref 4.22–5.81)
RDW: 18.2 % — ABNORMAL HIGH (ref 11.5–15.5)
WBC: 5.9 K/uL (ref 4.0–10.5)
nRBC: 0 % (ref 0.0–0.2)

## 2024-07-18 LAB — BASIC METABOLIC PANEL WITH GFR
Anion gap: 11 (ref 5–15)
BUN: 17 mg/dL (ref 6–20)
CO2: 22 mmol/L (ref 22–32)
Calcium: 9.1 mg/dL (ref 8.9–10.3)
Chloride: 107 mmol/L (ref 98–111)
Creatinine, Ser: 1.07 mg/dL (ref 0.61–1.24)
GFR, Estimated: >60 mL/min
Glucose, Bld: 127 mg/dL — ABNORMAL HIGH (ref 70–99)
Potassium: 4.3 mmol/L (ref 3.5–5.1)
Sodium: 140 mmol/L (ref 135–145)

## 2024-07-18 LAB — GLUCOSE, CAPILLARY
Glucose-Capillary: 143 mg/dL — ABNORMAL HIGH (ref 70–99)
Glucose-Capillary: 93 mg/dL (ref 70–99)

## 2024-07-18 MED ORDER — DROPERIDOL 2.5 MG/ML IJ SOLN: 0.6250 mg | Freq: Once | INTRAMUSCULAR | Status: DC | PRN

## 2024-07-18 MED ORDER — FENTANYL CITRATE (PF) 100 MCG/2ML IJ SOLN
INTRAMUSCULAR | Status: AC
  Filled 2024-07-18: qty 2

## 2024-07-18 MED ORDER — ACETAMINOPHEN 10 MG/ML IV SOLN: 1000.0000 mg | Freq: Once | INTRAVENOUS | Status: DC | PRN

## 2024-07-18 MED ORDER — ONDANSETRON HCL 4 MG/2ML IJ SOLN
INTRAMUSCULAR | Status: DC | PRN
  Administered 2024-07-18: 4 mg via INTRAVENOUS

## 2024-07-18 MED ORDER — OXYMETAZOLINE HCL 0.05 % NA SOLN
NASAL | Status: DC | PRN
  Administered 2024-07-18: 1 via TOPICAL

## 2024-07-18 MED ORDER — ORAL CARE MOUTH RINSE: 15.0000 mL | Freq: Once | OROMUCOSAL | Status: AC

## 2024-07-18 MED ORDER — INSULIN ASPART 100 UNIT/ML IJ SOLN
INTRAMUSCULAR | Status: AC
  Filled 2024-07-18: qty 2

## 2024-07-18 MED ORDER — PROPOFOL 10 MG/ML IV BOLUS
INTRAVENOUS | Status: DC | PRN
  Administered 2024-07-18: 10 mg via INTRAVENOUS

## 2024-07-18 MED ORDER — INSULIN ASPART 100 UNIT/ML IJ SOLN
0.0000 [IU] | INTRAMUSCULAR | Status: DC | PRN
  Administered 2024-07-18: 2 [IU] via SUBCUTANEOUS

## 2024-07-18 MED ORDER — LACTATED RINGERS IV SOLN: INTRAVENOUS | Status: DC

## 2024-07-18 MED ORDER — LIDOCAINE 2% (20 MG/ML) 5 ML SYRINGE
INTRAMUSCULAR | Status: DC | PRN
  Administered 2024-07-18: 60 mg via INTRAVENOUS

## 2024-07-18 MED ORDER — OXYCODONE HCL 5 MG/5ML PO SOLN: 5.0000 mg | Freq: Once | ORAL | Status: DC | PRN

## 2024-07-18 MED ORDER — OXYCODONE HCL 5 MG PO TABS: 5.0000 mg | ORAL_TABLET | Freq: Once | ORAL | Status: DC | PRN

## 2024-07-18 MED ORDER — PROPOFOL 500 MG/50ML IV EMUL
INTRAVENOUS | Status: DC | PRN
  Administered 2024-07-18: 100 ug/kg/min via INTRAVENOUS

## 2024-07-18 MED ORDER — FENTANYL CITRATE (PF) 100 MCG/2ML IJ SOLN: 25.0000 ug | INTRAMUSCULAR | Status: DC | PRN

## 2024-07-18 MED ORDER — CHLORHEXIDINE GLUCONATE 0.12 % MT SOLN
15.0000 mL | Freq: Once | OROMUCOSAL | Status: AC
  Administered 2024-07-18: 15 mL via OROMUCOSAL
  Filled 2024-07-18: qty 15

## 2024-07-18 MED ORDER — PROPOFOL 10 MG/ML IV BOLUS
INTRAVENOUS | Status: AC
  Filled 2024-07-18: qty 20

## 2024-07-18 NOTE — Op Note (Signed)
 Operative note  Preoperative diagnosis: OSA Postoperative diagnosis:   Same CPT 31575 Procedure: DISE ( Drug induced sleep endoscopy)  Anesthesia: Topical lidocaine  gel Complications: None Condition is stable throughout exam  Indications and consent: The patient presents to my otolaryngology clinic with a chief complaint of OSA  Because of pt's OSA and desire to be a candidate for Inspire therapy, it was recommended that they undergo a flexible fiberoptic laryngoscopy under sedation (DISE).   All the risks, benefits, and potential complications were reviewed with the patient preoperatively and informed consent was obtained.  Procedure: Pt was brought back to the OR and laid supine on the table. Propofol  anesthesia was administered per protocol until pt reached optimal level of sedation. DISE exam showed the following anatomic collapse pattern.  VOTE classification Velopharynx- 2, AP Oropharynx- 0 Tongue base- 1 AP Epiglottis- 1 AP Airway improved with jaw lift No concentric collapse  Based on the DISE findings, pt Other: is a candidate for Hypoglossal nerve stimulation (Inspire therapy) based on the above anatomy.

## 2024-07-18 NOTE — Anesthesia Postprocedure Evaluation (Signed)
"   Anesthesia Post Note  Patient: Allen Walton  Procedure(s) Performed: DRUG INDUCED SLEEP ENDOSCOPY (Nose)     Patient location during evaluation: PACU Anesthesia Type: MAC Level of consciousness: awake and alert Pain management: pain level controlled Vital Signs Assessment: post-procedure vital signs reviewed and stable Respiratory status: spontaneous breathing, nonlabored ventilation, respiratory function stable and patient connected to nasal cannula oxygen Cardiovascular status: stable and blood pressure returned to baseline Postop Assessment: no apparent nausea or vomiting Anesthetic complications: no   No notable events documented.  Last Vitals:  Vitals:   07/18/24 1345 07/18/24 1400  BP: 120/72 120/80  Pulse: (!) 46 (!) 37  Resp: 16 12  Temp:  36.7 C  SpO2: 100% 100%    Last Pain:  Vitals:   07/18/24 1400  TempSrc:   PainSc: 0-No pain                 [name]      "

## 2024-07-18 NOTE — Transfer of Care (Signed)
 Immediate Anesthesia Transfer of Care Note  Patient: Allen Walton  Procedure(s) Performed: DRUG INDUCED SLEEP ENDOSCOPY (Nose)  Patient Location: PACU  Anesthesia Type:MAC  Level of Consciousness: awake and drowsy  Airway & Oxygen Therapy: Patient Spontanous Breathing and Patient connected to nasal cannula oxygen  Post-op Assessment: Report given to RN and Post -op Vital signs reviewed and stable  Post vital signs: Reviewed and stable  Last Vitals:  Vitals Value Taken Time  BP 120/72 07/18/24 13:45  Temp    Pulse 45 07/18/24 13:46  Resp 17 07/18/24 13:46  SpO2 100 % 07/18/24 13:46  Vitals shown include unfiled device data.  Last Pain:  Vitals:   07/18/24 1114  TempSrc:   PainSc: 0-No pain         Complications: No notable events documented.

## 2024-07-18 NOTE — Discharge Instructions (Addendum)
 You are a candidate for Inspire surgery. Our surgery schedulers will call you if/when the insurance approves the surgery. Please call us  at [phone] if you have not heard back from us  in 2 weeks.   You can resume all of your medications

## 2024-07-18 NOTE — H&P (Signed)
 SUBJECTIVE:  Chief Complaint: CPAP intolerance Referring sleep doc: Francis Balo  HPI: Pt is a 54 y.o. year old male with a h/o OSA dx approx 2 yrs ago. Since diagnosis, pt been using their CPAP/BiPAP device, but has had difficulty tolaerating the CPAP. He is not uusing it consistently. AHI on most recent diagnostic sleep study is unknown. Pt has not been able to tolerate CPAP due to irritation. Pt has tried CPAP for their OSA. Pt has not had a DISE exam in the past. Pt had recent weight changes.  Pt does have co-existing cardiovascular disease and history of MI. Pt has not been  diagnosed with insomnia in the past.  HERE FOR ELECTIVE PROCEDURE. DENIES RECENT CHANGES IN HEALTH. Took eliquis and held ASA. He understands that he will need to hold both medications before implantation surgery.  Past Medical/Surgical History He  His  has a past surgical history that includes Hernia repair; Coronary/Graft Acute MI Revascularization (N/A, 11/29/2020); Coronary artery bypass graft (N/A, 11/29/2020); and TEE without cardioversion (N/A, 11/29/2020).  Past Family/Social History His family history includes Hypertension in his father. He  reports that he has quit smoking. His smoking use included cigarettes. He has never used smokeless tobacco. He reports that he does not drink alcohol and does not use drugs.  Medications/Allergies/Immunizations Allergies: Patient has no known allergies., Immunizations:  There is no immunization history on file for this patient.  Review of Systems  ROS: Constitutional: Negative for fever, weight loss and weight gain. Cardiovascular: Negative for chest pain and dyspnea on exertion. Respiratory: Is not experiencing shortness of breath at rest. Gastrointestinal: Negative for nausea and vomiting. Neurological: Negative for headaches. Psychiatric: The patient is not nervous/anxious  OBJECTIVE:  Physical Exam Blood pressure 133/71, pulse (!) 54, temperature 98.4 F  (36.9 C), temperature source Oral, resp. rate 18, height 6' 1 (1.854 m), weight 93.2 kg, SpO2 100%. General:  Well-developed, well-nourished, no apparent distress Peripheral Vascular:  Warm extremities with equal pulses Eyes: No nystagmus with equal extraocular motion bilaterally Neuro/Psych/Balance: Patient oriented to person, place, and time;  Appropriate mood and affect;  Gait is intact with no imbalance; Cranial nerves I-XII are intact Head and Face Inspection:  Normocephalic and atraumatic without mass or lesion Palpation:  Facial skeleton intact without bony stepoffs Salivary Glands:  No masses or tenderness Facial Strength:  Facial motility symmetric and full bilaterally ENT Pinna:  External ear intact and fully developed bilaterally External canal:  Canal is patent with intact skin bilaterally Tympanic Membrane:  Clear and mobile bilaterally Hearing: Midline Weber, pos Rinne bilaterally, normal clinical speech reception threshold (whispered voice, finger rub) External nose:  No scar or anatomic deformity Internal Nose:  Septum intact and midline.  No edema, polyp, or rhinorrhea. Lips, Teeth, and gums:  Mucosa and teeth intact and viable TMJ:  No pain to palpation with full mobility Oral cavity/oropharynx:  No erythema or exudate, 1+ tonsils Tongue/palate position: Friedman 3 Nasopharynx:  No mass or lesion with intact mucosa Hypopharynx:  Intact mucosa without pooling of secretions Larynx:  Full true vocal cord mobility without lesion or mass Neck Neck and Trachea:  Midline trachea without mass or lesion Thyroid :  No mass or nodularity Lymphatics:  No lymphadenopathy    ASSESSMENT/PLAN: OSA, with unknown severity. Unable to locate previous PSG or AHI in referral notes. He is with failure to tolerate PAP therapy and/or more conservative measures. Presence of smaller/absent tonsils and larger tongue position (Friedman tongue position or modified Mallampati) suggests that  hypopharyngeal/retrolingual collapse is contributing to the patient's OSA. Janeth, M et al. Staging of obstructive sleep apnea/hypopnea syndrome: a guide to appropriate treatment. Laryngoscope, 2004 Mar, 114(3):454-9. PMID: [id number]) Options including positional therapy, weight loss, oral appliances, PAP and surgical correction discussed. Pt could be a candidate for Hypoglossal nerve stimulation (Inspire therapy)  Here for DISE today. Understands risks of nose bleed and that the goal of this procedure it to determine candidacy for hypoglossal nerve stimulation implant. All questions answered.

## 2024-07-19 ENCOUNTER — Encounter (HOSPITAL_COMMUNITY): Payer: Self-pay

## 2024-07-26 ENCOUNTER — Other Ambulatory Visit (INDEPENDENT_AMBULATORY_CARE_PROVIDER_SITE_OTHER): Payer: Self-pay

## 2024-08-23 ENCOUNTER — Ambulatory Visit (INDEPENDENT_AMBULATORY_CARE_PROVIDER_SITE_OTHER)

## 2024-11-12 ENCOUNTER — Ambulatory Visit: Payer: Self-pay | Admitting: "Endocrinology
